# Patient Record
Sex: Female | Born: 1945 | Race: White | Hispanic: No | Marital: Married | Smoking: Never smoker
Health system: Southern US, Community
[De-identification: ages and names within clinical notes are randomized; demographics above are authoritative.]

## PROBLEM LIST (undated history)

## (undated) DIAGNOSIS — J45909 Unspecified asthma, uncomplicated: Secondary | ICD-10-CM

## (undated) DIAGNOSIS — E7800 Pure hypercholesterolemia, unspecified: Secondary | ICD-10-CM

## (undated) DIAGNOSIS — N390 Urinary tract infection, site not specified: Secondary | ICD-10-CM

## (undated) DIAGNOSIS — I10 Essential (primary) hypertension: Secondary | ICD-10-CM

## (undated) HISTORY — PX: BREAST EXCISIONAL BIOPSY: SUR124

## (undated) HISTORY — PX: BREAST LUMPECTOMY: SHX2

---

## 2011-07-24 DIAGNOSIS — A0472 Enterocolitis due to Clostridium difficile, not specified as recurrent: Secondary | ICD-10-CM | POA: Insufficient documentation

## 2011-08-30 DIAGNOSIS — M47816 Spondylosis without myelopathy or radiculopathy, lumbar region: Secondary | ICD-10-CM | POA: Insufficient documentation

## 2011-08-30 DIAGNOSIS — E559 Vitamin D deficiency, unspecified: Secondary | ICD-10-CM | POA: Insufficient documentation

## 2011-08-30 DIAGNOSIS — E7800 Pure hypercholesterolemia, unspecified: Secondary | ICD-10-CM | POA: Insufficient documentation

## 2011-08-30 DIAGNOSIS — D369 Benign neoplasm, unspecified site: Secondary | ICD-10-CM | POA: Insufficient documentation

## 2011-08-30 DIAGNOSIS — M8580 Other specified disorders of bone density and structure, unspecified site: Secondary | ICD-10-CM | POA: Insufficient documentation

## 2011-08-30 DIAGNOSIS — L719 Rosacea, unspecified: Secondary | ICD-10-CM | POA: Insufficient documentation

## 2011-08-30 DIAGNOSIS — M47812 Spondylosis without myelopathy or radiculopathy, cervical region: Secondary | ICD-10-CM | POA: Insufficient documentation

## 2014-04-24 ENCOUNTER — Emergency Department
Admission: EM | Admit: 2014-04-24 | Discharge: 2014-04-24 | Disposition: A | Discharge status: Home / Self Care | Payer: Medicare HMO | Source: Home / Self Care | Attending: Family Medicine | Admitting: Family Medicine

## 2014-04-24 ENCOUNTER — Encounter: Payer: Self-pay | Admitting: *Deleted

## 2014-04-24 DIAGNOSIS — R0789 Other chest pain: Secondary | ICD-10-CM

## 2014-04-24 MED ORDER — VALACYCLOVIR HCL 1 G PO TABS: 1000.0000 mg | ORAL_TABLET | Freq: Three times a day (TID) | ORAL | Status: DC

## 2014-04-24 MED ORDER — HYDROCODONE-ACETAMINOPHEN 5-325 MG PO TABS: ORAL_TABLET | ORAL | Status: DC

## 2014-04-24 NOTE — ED Notes (Signed)
Pt c/o right mid back pain, burning and tingling x this AM. No rash present. H/O chickenpox.

## 2014-04-24 NOTE — Discharge Instructions (Signed)
May take Ibuprofen 200mg , 4 tabs every 8 hours with food.   Chest Pain (Nonspecific) It is often hard to give a specific diagnosis for the cause of chest pain. There is always a chance that your pain could be related to something serious, such as a heart attack or a blood clot in the lungs. You need to follow up with your health care provider for further evaluation. CAUSES   Heartburn.  Pneumonia or bronchitis.  Anxiety or stress.  Inflammation around your heart (pericarditis) or lung (pleuritis or pleurisy).  A blood clot in the lung.  A collapsed lung (pneumothorax). It can develop suddenly on its own (spontaneous pneumothorax) or from trauma to the chest.  Shingles infection (herpes zoster virus). The chest wall is composed of bones, muscles, and cartilage. Any of these can be the source of the pain.  The bones can be bruised by injury.  The muscles or cartilage can be strained by coughing or overwork.  The cartilage can be affected by inflammation and become sore (costochondritis). DIAGNOSIS  Lab tests or other studies may be needed to find the cause of your pain. Your health care provider may have you take a test called an ambulatory electrocardiogram (ECG). An ECG records your heartbeat patterns over a 24-hour period. You may also have other tests, such as:  Transthoracic echocardiogram (TTE). During echocardiography, sound waves are used to evaluate how blood flows through your heart.  Transesophageal echocardiogram (TEE).  Cardiac monitoring. This allows your health care provider to monitor your heart rate and rhythm in real time.  Holter monitor. This is a portable device that records your heartbeat and can help diagnose heart arrhythmias. It allows your health care provider to track your heart activity for several days, if needed.  Stress tests by exercise or by giving medicine that makes the heart beat faster. TREATMENT   Treatment depends on what may be causing  your chest pain. Treatment may include:  Acid blockers for heartburn.  Anti-inflammatory medicine.  Pain medicine for inflammatory conditions.  Antibiotics if an infection is present.  You may be advised to change lifestyle habits. This includes stopping smoking and avoiding alcohol, caffeine, and chocolate.  You may be advised to keep your head raised (elevated) when sleeping. This reduces the chance of acid going backward from your stomach into your esophagus. Most of the time, nonspecific chest pain will improve within 2-3 days with rest and mild pain medicine.  HOME CARE INSTRUCTIONS   If antibiotics were prescribed, take them as directed. Finish them even if you start to feel better.  For the next few days, avoid physical activities that bring on chest pain. Continue physical activities as directed.  Do not use any tobacco products, including cigarettes, chewing tobacco, or electronic cigarettes.  Avoid drinking alcohol.  Only take medicine as directed by your health care provider.  Follow your health care provider's suggestions for further testing if your chest pain does not go away.  Keep any follow-up appointments you made. If you do not go to an appointment, you could develop lasting (chronic) problems with pain. If there is any problem keeping an appointment, call to reschedule. SEEK MEDICAL CARE IF:   Your chest pain does not go away, even after treatment.  You have a rash with blisters on your chest.  You have a fever. SEEK IMMEDIATE MEDICAL CARE IF:   You have increased chest pain or pain that spreads to your arm, neck, jaw, back, or abdomen.  You  have shortness of breath.  You have an increasing cough, or you cough up blood.  You have severe back or abdominal pain.  You feel nauseous or vomit.  You have severe weakness.  You faint.  You have chills. This is an emergency. Do not wait to see if the pain will go away. Get medical help at once. Call your  local emergency services (911 in [place] Do not drive yourself to the hospital. MAKE SURE YOU:   Understand these instructions.  Will watch your condition.  Will get help right away if you are not doing well or get worse. Document Released: 10/04/2004 Document Revised: 12/30/2012 Document Reviewed: 07/31/2007 [facility] Patient Information 2015 [place], [place]. This information is not intended to replace advice given to you by your health care provider. Make sure you discuss any questions you have with your health care provider.

## 2014-04-24 NOTE — ED Provider Notes (Signed)
CSN: [id number]     Arrival date & time 04/24/14  1221 History   First MD Initiated Contact with Patient 04/24/14 1239     Chief Complaint  Patient presents with  . Back Pain      HPI Comments: Patient complains of developing unexplained fatigue about three days ago, and chills during the past two days.  This morning she awoke with a painful burning sensation over her right upper back.  No rash.  No cough.  Her pain is not pleuritic. She notes that she had the Zostavax immunization seven years ago.  The history is provided by the patient and the spouse.    History reviewed. No pertinent past medical history. Past Surgical History  Procedure Laterality Date  . Breast lumpectomy Right    Family History  Problem Relation Age of Onset  . Diabetes Father    History  Substance Use Topics  . Smoking status: Never Smoker   . Smokeless tobacco: Never Used  . Alcohol Use: No   OB History    No data available     Review of Systems  Constitutional: Positive for chills and fatigue. Negative for fever, diaphoresis, appetite change and unexpected weight change.  HENT: Negative.   Eyes: Negative.   Respiratory: Negative.   Cardiovascular: Negative.   Gastrointestinal: Negative.   Genitourinary: Negative.   Musculoskeletal: Positive for back pain. Negative for myalgias, joint swelling, arthralgias, neck pain and neck stiffness.  Skin: Negative for rash.  Neurological: Negative for headaches.    Allergies  Codeine  Home Medications   Prior to Admission medications   Medication Sig Start Date End Date Taking? Authorizing Provider  Celecoxib (CELEBREX PO) Take by mouth.   Yes Historical Provider, MD  Red Yeast Rice Extract (RED YEAST RICE PO) Take by mouth.   Yes Historical Provider, MD  HYDROcodone-acetaminophen (NORCO/VICODIN) 5-325 MG per tablet Take one by mouth at bedtime as needed for pain 04/24/14   Kandra Nicolas, MD  valACYclovir (VALTREX) 1000 MG tablet Take 1 tablet  (1,000 mg total) by mouth 3 (three) times daily. 04/24/14   Kandra Nicolas, MD   BP 171/75 mmHg  Pulse 63  Resp 16  Ht 5\' 5"  (1.651 m)  Wt 145 lb (65.772 kg)  BMI 24.13 kg/m2  SpO2 99% Physical Exam  Constitutional: She is oriented to person, place, and time. She appears well-developed and well-nourished. No distress.  HENT:  Head: Normocephalic.  Nose: Nose normal.  Mouth/Throat: Oropharynx is clear and moist.  Eyes: Conjunctivae are normal. Pupils are equal, round, and reactive to light.  Neck: Neck supple.  Cardiovascular: Normal heart sounds.   Pulmonary/Chest: Breath sounds normal.    Area of patient's back pain is noted in blue on diagram.  However, there is no tenderness to palpation there, and no erythema or warmth.  No rash present.  Abdominal: Bowel sounds are normal. There is no tenderness.  Musculoskeletal: She exhibits no edema or tenderness.  Lymphadenopathy:    She has no cervical adenopathy.  Neurological: She is alert and oriented to person, place, and time.  Skin: Skin is warm and dry. No rash noted.  Nursing note and vitals reviewed.   ED Course  Procedures  none   MDM   1. Posterior chest pain; presumptive early herpes zoster    Will empirically begin Valtrex.  Lortab for pain at bedtime (has take hydrocodone without adverse effect) May take Ibuprofen 200mg , 4 tabs every 8 hours with food.  Followup  with Family Doctor if not improved in one week.     Kandra Nicolas, MD 04/24/14 1300

## 2014-08-04 ENCOUNTER — Encounter: Payer: Self-pay | Admitting: *Deleted

## 2014-08-04 ENCOUNTER — Emergency Department
Admission: EM | Admit: 2014-08-04 | Discharge: 2014-08-04 | Disposition: A | Discharge status: Home / Self Care | Payer: Medicare HMO | Source: Home / Self Care | Attending: Family Medicine | Admitting: Family Medicine

## 2014-08-04 DIAGNOSIS — R300 Dysuria: Secondary | ICD-10-CM | POA: Diagnosis not present

## 2014-08-04 HISTORY — DX: Pure hypercholesterolemia, unspecified: E78.00

## 2014-08-04 LAB — POCT CBC W AUTO DIFF (K'VILLE URGENT CARE)

## 2014-08-04 LAB — POCT URINALYSIS DIP (MANUAL ENTRY)
Bilirubin, UA: NEGATIVE
Glucose, UA: NEGATIVE
Ketones, POC UA: NEGATIVE
Leukocytes, UA: NEGATIVE
Nitrite, UA: NEGATIVE
PROTEIN UA: NEGATIVE
RBC UA: NEGATIVE
Spec Grav, UA: 1.02 (ref 1.005–1.03)
Urobilinogen, UA: 0.2 (ref 0–1)
pH, UA: 5 (ref 5–8)

## 2014-08-04 MED ORDER — SULFAMETHOXAZOLE-TRIMETHOPRIM 800-160 MG PO TABS: 1.0000 | ORAL_TABLET | Freq: Two times a day (BID) | ORAL | Status: DC

## 2014-08-04 NOTE — ED Provider Notes (Signed)
CSN: [id number]     Arrival date & time 08/04/14  1047 History   First MD Initiated Contact with Patient 08/04/14 1151     Chief Complaint  Patient presents with  . Urinary Tract Infection      HPI Comments: Last night patient developed frequency and nocturia, and today mild dysuria.  She complains of lower abdominal bloating and discomfort, and increased lower back discomfort.  No fevers, chills, and sweats.  She states that she had UTI three weeks ago treated with Cipro initially, and then switched to Septra.  She has been taking cranberry pills.  She took a left-over dose of Cipro last night.  Patient is a 69 y.o. female presenting with dysuria. The history is provided by the patient.  Dysuria Pain quality:  Burning Pain severity:  Mild Onset quality:  Sudden Duration:  1 day Timing:  Constant Progression:  Worsening Chronicity:  Recurrent Recent urinary tract infections: yes   Relieved by:  Nothing Worsened by:  Nothing tried Ineffective treatments: cranberry pills. Urinary symptoms: frequent urination and hesitancy   Urinary symptoms: no discolored urine, no foul-smelling urine, no hematuria and no bladder incontinence   Associated symptoms: abdominal pain   Associated symptoms: no fever, no flank pain, no genital lesions, no nausea, no vaginal discharge and no vomiting   Risk factors: recurrent urinary tract infections     Past Medical History  Diagnosis Date  . High cholesterol    Past Surgical History  Procedure Laterality Date  . Breast lumpectomy Right    Family History  Problem Relation Age of Onset  . Diabetes Father   . Liver disease Father   . Alzheimer's disease Mother    History  Substance Use Topics  . Smoking status: Never Smoker   . Smokeless tobacco: Never Used  . Alcohol Use: No   OB History    No data available     Review of Systems  Constitutional: Negative for fever.  Gastrointestinal: Positive for abdominal pain. Negative for nausea and  vomiting.  Genitourinary: Positive for dysuria. Negative for flank pain and vaginal discharge.  All other systems reviewed and are negative.   Allergies  Codeine  Home Medications   Prior to Admission medications   Medication Sig Start Date End Date Taking? Authorizing Provider  Celecoxib (CELEBREX PO) Take by mouth.    Historical Provider, MD  Red Yeast Rice Extract (RED YEAST RICE PO) Take by mouth.    Historical Provider, MD  sulfamethoxazole-trimethoprim (BACTRIM DS,SEPTRA DS) 800-160 MG per tablet Take 1 tablet by mouth 2 (two) times daily. 08/04/14   Kandra Nicolas, MD  valACYclovir (VALTREX) 1000 MG tablet Take 1 tablet (1,000 mg total) by mouth 3 (three) times daily. 04/24/14   Kandra Nicolas, MD   BP 135/80 mmHg  Pulse 63  Temp(Src) 98.4 F (36.9 C) (Oral)  Resp 14  Wt 144 lb (65.318 kg)  SpO2 97% Physical Exam  Constitutional: She is oriented to person, place, and time. She appears well-developed and well-nourished. No distress.  HENT:  Head: Normocephalic.  Mouth/Throat: Oropharynx is clear and moist.  Eyes: Conjunctivae are normal. Pupils are equal, round, and reactive to light.  Neck: Neck supple.  Cardiovascular: Normal heart sounds.   Pulmonary/Chest: Breath sounds normal.  Abdominal: Bowel sounds are normal. She exhibits no distension and no mass. There is no hepatosplenomegaly. There is tenderness. There is no rigidity, no rebound, no guarding, no CVA tenderness, no tenderness at McBurney's point and negative Murphy's  sign.    Patient has vague abdominal tenderness as noted on diagram.  Negative iliopsoas and obdurator tests.    Musculoskeletal: She exhibits no edema.  Lymphadenopathy:    She has no cervical adenopathy.  Neurological: She is alert and oriented to person, place, and time.  Skin: Skin is warm and dry. No rash noted.  Nursing note and vitals reviewed.   ED Course  Procedures  None    Labs Reviewed  URINE CULTURE  POCT URINALYSIS DIP  (MANUAL ENTRY) negative  POCT CBC W AUTO DIFF ([facility]):  WBC 5.7; LY 32.4; MO 4.5; GR 63.1; Hgb 15.3; Platelets 214       MDM   1. Dysuria    Urine culture pending. Begin Septra DS BID for 10 days. Continue increased fluid intake.  May continue taking cranberry pills. If symptoms become significantly worse during the night or over the weekend, proceed to the local emergency room.  Followup with Family Doctor if not improved in two weeks.    Kandra Nicolas, MD 08/08/14 [phone]

## 2014-08-04 NOTE — ED Notes (Signed)
Dysuria.  Started last night around 11.  No Blood in urine, fever, or chilling.  1 dose of Cipro last night at 11.

## 2014-08-04 NOTE — Discharge Instructions (Signed)
Continue increased fluid intake.  May continue taking cranberry pills. If symptoms become significantly worse during the night or over the weekend, proceed to the local emergency room.    Dysuria Dysuria is the medical term for pain with urination. There are many causes for dysuria, but urinary tract infection is the most common. If a urinalysis was performed it can show that there is a urinary tract infection. A urine culture confirms that you or your child is sick. You will need to follow up with a healthcare provider because:  If a urine culture was done you will need to know the culture results and treatment recommendations.  If the urine culture was positive, you or your child will need to be put on antibiotics or know if the antibiotics prescribed are the right antibiotics for your urinary tract infection.  If the urine culture is negative (no urinary tract infection), then other causes may need to be explored or antibiotics need to be stopped. Today laboratory work may have been done and there does not seem to be an infection. If cultures were done they will take at least 24 to 48 hours to be completed. Today x-rays may have been taken and they read as normal. No cause can be found for the problems. The x-rays may be re-read by a radiologist and you will be contacted if additional findings are made. You or your child may have been put on medications to help with this problem until you can see your primary caregiver. If the problems get better, see your primary caregiver if the problems return. If you were given antibiotics (medications which kill germs), take all of the mediations as directed for the full course of treatment.  If laboratory work was done, you need to find the results. Leave a telephone number where you can be reached. If this is not possible, make sure you find out how you are to get test results. HOME CARE INSTRUCTIONS   Drink lots of fluids. For adults, drink eight, 8  ounce glasses of clear juice or water a day. For children, replace fluids as suggested by your caregiver.  Empty the bladder often. Avoid holding urine for long periods of time.  After a bowel movement, women should cleanse front to back, using each tissue only once.  Empty your bladder before and after sexual intercourse.  Take all the medicine given to you until it is gone. You may feel better in a few days, but TAKE ALL MEDICINE.  Avoid caffeine, tea, alcohol and carbonated beverages, because they tend to irritate the bladder.  In men, alcohol may irritate the prostate.  Only take over-the-counter or prescription medicines for pain, discomfort, or fever as directed by your caregiver.  If your caregiver has given you a follow-up appointment, it is very important to keep that appointment. Not keeping the appointment could result in a chronic or permanent injury, pain, and disability. If there is any problem keeping the appointment, you must call back to this facility for assistance. SEEK IMMEDIATE MEDICAL CARE IF:   Back pain develops.  A fever develops.  There is nausea (feeling sick to your stomach) or vomiting (throwing up).  Problems are no better with medications or are getting worse. MAKE SURE YOU:   Understand these instructions.  Will watch your condition.  Will get help right away if you are not doing well or get worse. Document Released: 09/23/2003 Document Revised: 03/19/2011 Document Reviewed: 07/31/2007 [facility] Patient Information 2015 [place], [place]. This information  is not intended to replace advice given to you by your health care provider. Make sure you discuss any questions you have with your health care provider.

## 2014-08-05 LAB — URINE CULTURE
Colony Count: NO GROWTH
ORGANISM ID, BACTERIA: NO GROWTH

## 2014-08-10 ENCOUNTER — Telehealth: Payer: Self-pay | Admitting: *Deleted

## 2014-08-11 ENCOUNTER — Emergency Department
Admission: EM | Admit: 2014-08-11 | Discharge: 2014-08-11 | Disposition: A | Discharge status: Home / Self Care | Payer: Medicare HMO | Source: Home / Self Care

## 2014-08-11 ENCOUNTER — Encounter: Payer: Self-pay | Admitting: *Deleted

## 2014-08-11 DIAGNOSIS — M549 Dorsalgia, unspecified: Secondary | ICD-10-CM

## 2014-08-11 DIAGNOSIS — Z8619 Personal history of other infectious and parasitic diseases: Secondary | ICD-10-CM | POA: Diagnosis not present

## 2014-08-11 MED ORDER — HYDROCODONE-ACETAMINOPHEN 5-325 MG PO TABS: 1.0000 | ORAL_TABLET | Freq: Four times a day (QID) | ORAL | Status: DC | PRN

## 2014-08-11 MED ORDER — IBUPROFEN 600 MG PO TABS: 600.0000 mg | ORAL_TABLET | Freq: Four times a day (QID) | ORAL | Status: DC | PRN

## 2014-08-11 MED ORDER — VALACYCLOVIR HCL 1 G PO TABS: 1000.0000 mg | ORAL_TABLET | Freq: Three times a day (TID) | ORAL | Status: DC

## 2014-08-11 NOTE — ED Provider Notes (Signed)
CSN: [id number]     Arrival date & time 08/11/14  1659 History   None    Chief Complaint  Patient presents with  . Back Pain   (Consider location/radiation/quality/duration/timing/severity/associated sxs/prior Treatment) HPI The patient is a 69 year old female presenting to urgent care with sudden onset right mid to lower back pain that started this morning.  Patient states pain is moderate in severity, aching and throbbing feels similar to when she was treated for shingles 4 months ago in April of this year.  Patient states she did get the shingles vaccine.  However, in April when she had similar pain.  She did break out into a small red vesicular rash a few days after starting Valtrex.  Patient states symptoms at that time lasted about 6 weeks but completely resolved.  Patient states pain today is in the exact same location as it was 4 months ago.  She has not noticed any rash as of yet. .  Denies fevers, chills, nausea, vomiting or diarrhea.  Denies recent heavy lifting or falls.  Denies known injuries.  Denies any other symptoms.  Past Medical History  Diagnosis Date  . High cholesterol    Past Surgical History  Procedure Laterality Date  . Breast lumpectomy Right    Family History  Problem Relation Age of Onset  . Diabetes Father   . Liver disease Father   . Alzheimer's disease Mother    History  Substance Use Topics  . Smoking status: Never Smoker   . Smokeless tobacco: Never Used  . Alcohol Use: No   OB History    No data available     Review of Systems  Constitutional: Negative for fever and chills.  Gastrointestinal: Negative for nausea and vomiting.  Genitourinary: Negative for dysuria, hematuria and flank pain.  Musculoskeletal: Positive for myalgias and back pain. Negative for joint swelling, arthralgias, gait problem, neck pain and neck stiffness.  Skin: Negative for color change, rash and wound.  Neurological: Negative for weakness and numbness.    Allergies    Codeine  Home Medications   Prior to Admission medications   Medication Sig Start Date End Date Taking? Authorizing Provider  Celecoxib (CELEBREX PO) Take by mouth.    Historical Provider, MD  HYDROcodone-acetaminophen (NORCO/VICODIN) 5-325 MG per tablet Take 1 tablet by mouth every 6 (six) hours as needed for moderate pain or severe pain. 08/11/14   Noland Fordyce, PA-C  ibuprofen (ADVIL,MOTRIN) 600 MG tablet Take 1 tablet (600 mg total) by mouth every 6 (six) hours as needed. 08/11/14   Noland Fordyce, PA-C  Red Yeast Rice Extract (RED YEAST RICE PO) Take by mouth.    Historical Provider, MD  valACYclovir (VALTREX) 1000 MG tablet Take 1 tablet (1,000 mg total) by mouth 3 (three) times daily. For 7 days 08/11/14   Noland Fordyce, PA-C   BP 119/71 mmHg  Pulse 82  Temp(Src) 98.5 F (36.9 C) (Oral)  Resp 16  SpO2 97% Physical Exam  Constitutional: She is oriented to person, place, and time. She appears well-developed and well-nourished.  HENT:  Head: Normocephalic and atraumatic.  Eyes: EOM are normal.  Neck: Normal range of motion. Neck supple.  Cardiovascular: Normal rate.   Pulmonary/Chest: Effort normal.  Musculoskeletal: Normal range of motion. She exhibits tenderness. She exhibits no edema.       Back:  Tenderness to Right mid to lower back even with light touch. No bony tenderness. FROM upper and lower extremities with 5/5 strength bilaterally.  Neurological: She  is alert and oriented to person, place, and time.  Normal sensation in upper and lower extremities bilaterally, normal gait.  Skin: Skin is warm and dry. No rash noted. No erythema.  Psychiatric: She has a normal mood and affect. Her behavior is normal.  Nursing note and vitals reviewed.   ED Course  Procedures (including critical care time) Labs Review Labs Reviewed - No data to display  Imaging Review No results found.   MDM   1. Mid back pain on right side   2. History of shingles     Patient is a  69 year old female with history of shingles rash 4 months ago presenting to urgent care with back pain similar to last time she was treated for shingles. Patient is tender with light touch to right mid to lower back.  No bony tenderness.  No rash at this time.  Due to patient's history will treat as a mother shingles outbreak starting patient on Valtrex.  Also prescribed Vicodin and ibuprofen as needed for pain.  Patient counseled on use of narcotic pain medications. Counseled not to combine these medications with others containing tylenol. Urged not to drink alcohol, drive, or perform any other activities that requires focus while taking these medications.   Advise patient to follow-up with PCP within one week if not improving, sooner if worsening. Pt verbalized understanding and agreement with tx plan.      Noland Fordyce, PA-C 08/11/14 [phone]

## 2014-08-11 NOTE — ED Notes (Signed)
Pt c/o right sided back pain without injury x this AM. Pain is throbbing. She was treated for shingles in April, reports same sensation on right side of back with shingles.

## 2014-08-11 NOTE — Discharge Instructions (Signed)
Lortab (hydrocodone-acetaminophen) is a narcotic pain medication, do not combine these medications with others containing tylenol. While taking, do not drink alcohol, drive, or perform any other activities that requires focus while taking these medications.   If you take the prescribed ibuprofen, do not also take your celebrex as both are considered NSAIDs, which help pain and inflammation.

## 2014-10-01 LAB — HM COLONOSCOPY

## 2015-01-19 LAB — HM DEXA SCAN: HM Dexa Scan: NEGATIVE

## 2015-04-05 ENCOUNTER — Emergency Department
Admission: EM | Admit: 2015-04-05 | Discharge: 2015-04-05 | Disposition: A | Discharge status: Home / Self Care | Payer: Medicare HMO | Source: Home / Self Care | Attending: Emergency Medicine | Admitting: Emergency Medicine

## 2015-04-05 ENCOUNTER — Encounter: Payer: Self-pay | Admitting: *Deleted

## 2015-04-05 DIAGNOSIS — R091 Pleurisy: Secondary | ICD-10-CM

## 2015-04-05 DIAGNOSIS — J069 Acute upper respiratory infection, unspecified: Secondary | ICD-10-CM | POA: Diagnosis not present

## 2015-04-05 DIAGNOSIS — R071 Chest pain on breathing: Secondary | ICD-10-CM

## 2015-04-05 MED ORDER — PREDNISONE 10 MG (21) PO TBPK: ORAL_TABLET | ORAL | Status: DC

## 2015-04-05 MED ORDER — AZITHROMYCIN 250 MG PO TABS: ORAL_TABLET | ORAL | Status: DC

## 2015-04-05 NOTE — ED Provider Notes (Signed)
CSN: [id number]     Arrival date & time 04/05/15  1455 History   First MD Initiated Contact with Patient 04/05/15 1507     Chief Complaint  Patient presents with  . Chest Pain  . Sinus Problem    HPI  Sheryl Freeman is a 70 y.o. female who complains of onset of URI symptoms 8 days ago with sinus congestion and discolored rhinorrhea associated with low-grade fever. It seemed to improve somewhat 4 days ago, but now worsening discolored rhinorrhea, low-grade fever, mild facial pain, occasional nonproductive cough. Then, developed mild pleuritic sharp and pressure chest pain. Not exertional cp. Feels mild anterior wheezing at times with mild dyspnea with the wheezing with exertion. Denies radiating pain, numbness, nausea, diaphoresis.     Has been using over-the-counter treatment for sinus sxs, eg mucinex, which helps a little. Denies a current diagnosis of asthma, but she states she might of had asthma as a child which she outgrew.  She states she is going out of town in a couple days and she requests aggressive treatment with antibiotic and oral prednisone, as this has helped similar symptoms a few years ago.  No chills/sweats +  Fever  +  Nasal congestion +  Discolored Post-nasal drainage Positive sinus pain/pressure No sore throat  + Minimal cough Positive wheezing Mild chest congestion No hemoptysis No pleuritic pain  No itchy/red eyes No earache  No nausea No vomiting No abdominal pain No diarrhea  No skin rashes + Mild Fatigue. No syncope or focal neurologic symptoms No myalgias No headache  Past Medical History  Diagnosis Date  . High cholesterol    Past Surgical History  Procedure Laterality Date  . Breast lumpectomy Right    Family History  Problem Relation Age of Onset  . Diabetes Father   . Liver disease Father   . Alzheimer's disease Mother    Social History  Substance Use Topics  . Smoking status: Never Smoker   . Smokeless tobacco: Never Used  .  Alcohol Use: No   OB History    No data available     Review of Systems  All other systems reviewed and are negative.   Allergies  Codeine  Home Medications   Prior to Admission medications   Medication Sig Start Date End Date Taking? Authorizing Provider  azithromycin (ZITHROMAX Z-PAK) 250 MG tablet Take 2 tablets on day one, then 1 tablet daily on days 2 through 5 04/05/15   Jacqulyn Cane, MD  Celecoxib (CELEBREX PO) Take by mouth.    Historical Provider, MD  ibuprofen (ADVIL,MOTRIN) 600 MG tablet Take 1 tablet (600 mg total) by mouth every 6 (six) hours as needed. 08/11/14   Noland Fordyce, PA-C  predniSONE (STERAPRED UNI-PAK 21 TAB) 10 MG (21) TBPK tablet Take as directed for 6 days.--Take 6 on day 1, 5 on day 2, 4 on day 3, then 3 on day 4, then 2  on day 5, then 1 on day 6. 04/05/15   Jacqulyn Cane, MD  Red Yeast Rice Extract (RED YEAST RICE PO) Take by mouth.    Historical Provider, MD  valACYclovir (VALTREX) 1000 MG tablet Take 1 tablet (1,000 mg total) by mouth 3 (three) times daily. For 7 days 08/11/14   Noland Fordyce, PA-C   Meds Ordered and Administered this Visit  Medications - No data to display  BP 155/77 mmHg  Pulse 73  Temp(Src) 98.1 F (36.7 C) (Oral)  Resp 18  SpO2 98% No data found.  Physical Exam  Constitutional: She is oriented to person, place, and time. She appears well-developed and well-nourished. No distress.  HENT:  Head: Normocephalic and atraumatic.  Right Ear: Tympanic membrane, external ear and ear canal normal.  Left Ear: Tympanic membrane, external ear and ear canal normal.  Nose: Mucosal edema and rhinorrhea present. Right sinus exhibits maxillary sinus tenderness. Left sinus exhibits maxillary sinus tenderness.  Mouth/Throat: Oropharynx is clear and moist. No oral lesions. No oropharyngeal exudate.  Eyes: Right eye exhibits no discharge. Left eye exhibits no discharge. No scleral icterus.  Neck: Neck supple. No JVD present. No tracheal  deviation present.  Cardiovascular: Normal rate, regular rhythm and normal heart sounds.  Exam reveals no gallop and no friction rub.   No murmur heard. Pulmonary/Chest: Effort normal. She has wheezes (Very mild late expiratory wheezes anteriorly, especially on forced expiration. Good and equal air movement bilaterally). She has rhonchi. She has no rales.  Abdominal: She exhibits no distension.  Musculoskeletal: She exhibits no edema.  Lymphadenopathy:    She has no cervical adenopathy.  Neurological: She is alert and oriented to person, place, and time. No cranial nerve deficit.  Skin: Skin is warm and dry. No rash noted.  Psychiatric: She has a normal mood and affect.  Nursing note and vitals reviewed.   ED Course  ED EKG  Date/Time: 04/05/2015 2:59 PM Performed by: Burnett Harry, DAVID Authorized by: Theone Murdoch A Rhythm: sinus rhythm Ectopy comments: None Rate: normal QRS axis: normal Conduction: conduction normal ST Segments: ST segments normal T Waves: T waves normal Other: no other findings Clinical impression: normal ECG    Labs Review Labs Reviewed - No data to display  Imaging Review No results found.    MDM   1. Acute upper respiratory infection   2. Pleurisy   3. Chest pain on breathing   Likely has acute maxillary sinusitis, mild acute bronchitis with pleurisy and mild wheezing. O2 saturation normal 98%. Good air movement bilaterally on auscultation. No respiratory distress. Chest discomfort is only pleuritic. EKG normal. No evidence of acute cardiorespiratory event. She declined chest x-ray. She declined DuoNeb or albuterol inhaler.  Treatment options discussed, as well as risks, benefits, alternatives. Patient voiced understanding and agreement with the following plans: Discharge Medication List as of 04/05/2015  3:33 PM    START taking these medications   Details  azithromycin (ZITHROMAX Z-PAK) 250 MG tablet Take 2 tablets on day one, then 1 tablet  daily on days 2 through 5, Normal    predniSONE (STERAPRED UNI-PAK 21 TAB) 10 MG (21) TBPK tablet Take as directed for 6 days.--Take 6 on day 1, 5 on day 2, 4 on day 3, then 3 on day 4, then 2  on day 5, then 1 on day 6., Normal       She declined steroid nasal spray. Other symptomatic care discussed. May use Tylenol or ibuprofen when necessary discomfort. Follow-up with your primary care doctor in 5-7 days if not improving, or sooner if symptoms become worse. Precautions discussed. Red flags discussed. Questions invited and answered. Patient voiced understanding and agreement.     Jacqulyn Cane, MD 04/05/15 1540

## 2015-04-05 NOTE — ED Notes (Signed)
Pt c/o 1 week of sinus fullness, and discharge, little cough. 2 days ago developed central CP, described at "someone sitting on my chest" and SOB with exertion. Denies radiating pain, numbness, nausea, diaphoresis.

## 2015-06-02 ENCOUNTER — Emergency Department
Admission: EM | Admit: 2015-06-02 | Discharge: 2015-06-02 | Disposition: A | Discharge status: Home / Self Care | Payer: Medicare HMO | Source: Home / Self Care | Attending: Family Medicine | Admitting: Family Medicine

## 2015-06-02 ENCOUNTER — Encounter: Payer: Self-pay | Admitting: *Deleted

## 2015-06-02 DIAGNOSIS — R300 Dysuria: Secondary | ICD-10-CM | POA: Diagnosis not present

## 2015-06-02 DIAGNOSIS — N390 Urinary tract infection, site not specified: Secondary | ICD-10-CM

## 2015-06-02 LAB — POCT URINALYSIS DIP (MANUAL ENTRY)
Bilirubin, UA: NEGATIVE
Glucose, UA: NEGATIVE
Nitrite, UA: POSITIVE — AB
Protein Ur, POC: NEGATIVE
Spec Grav, UA: >=1.03 (ref 1.005–1.03)
Urobilinogen, UA: 0.2 (ref 0–1)
pH, UA: 5 (ref 5–8)

## 2015-06-02 MED ORDER — SULFAMETHOXAZOLE-TRIMETHOPRIM 800-160 MG PO TABS: 1.0000 | ORAL_TABLET | Freq: Two times a day (BID) | ORAL | Status: AC

## 2015-06-02 NOTE — ED Notes (Signed)
Pt c/o dysuria and bloating since this AM. Denies flank pain, hematuria or fever.

## 2015-06-02 NOTE — ED Provider Notes (Signed)
CSN: [id number]     Arrival date & time 06/02/15  1339 History   First MD Initiated Contact with Patient 06/02/15 1400     Chief Complaint  Patient presents with  . Dysuria   (Consider location/radiation/quality/duration/timing/severity/associated sxs/prior Treatment) HPI The pt is a 70yo female presenting to [facility] with c/o sudden onset, gradually worsening dysuria and bloating with lower abdominal discomfort that started this morning.  She does recall "not feeling well" last night then urinary symptoms this morning.  Symptoms feel similar to prior UTIs. She has not tried anything for symptoms yet as she knows Azo can alter test results. She is leaving to go to the beach for 1 week this weekend and wants to make sure she is okay to go. Denies fever, chills, n/v/d. Denies flank pain or hematuria.   Past Medical History  Diagnosis Date  . High cholesterol    Past Surgical History  Procedure Laterality Date  . Breast lumpectomy Right    Family History  Problem Relation Age of Onset  . Diabetes Father   . Liver disease Father   . Alzheimer's disease Mother    Social History  Substance Use Topics  . Smoking status: Never Smoker   . Smokeless tobacco: Never Used  . Alcohol Use: No   OB History    No data available     Review of Systems  Constitutional: Negative for fever and chills.  HENT: Negative for congestion, ear pain, sore throat, trouble swallowing and voice change.   Respiratory: Negative for cough and shortness of breath.   Cardiovascular: Negative for chest pain and palpitations.  Gastrointestinal: Positive for abdominal pain ( lower abdominal discomfort). Negative for nausea, vomiting and diarrhea.  Genitourinary: Positive for dysuria, urgency and frequency. Negative for hematuria, flank pain and decreased urine volume.  Musculoskeletal: Negative for myalgias, back pain and arthralgias.  Skin: Negative for rash.    Allergies  Codeine  Home Medications   Prior to  Admission medications   Medication Sig Start Date End Date Taking? Authorizing Provider  Celecoxib (CELEBREX PO) Take by mouth.    Historical Provider, MD  Red Yeast Rice Extract (RED YEAST RICE PO) Take by mouth.    Historical Provider, MD  sulfamethoxazole-trimethoprim (BACTRIM DS,SEPTRA DS) 800-160 MG tablet Take 1 tablet by mouth 2 (two) times daily. For 10 days 06/02/15 06/09/15  Noland Fordyce, PA-C   Meds Ordered and Administered this Visit  Medications - No data to display  BP 162/88 mmHg  Pulse 91  Temp(Src) 98 F (36.7 C) (Oral)  Wt 150 lb (68.04 kg)  SpO2 95% No data found.   Physical Exam  Constitutional: She is oriented to person, place, and time. She appears well-developed and well-nourished.  HENT:  Head: Normocephalic and atraumatic.  Mouth/Throat: Oropharynx is clear and moist.  Eyes: EOM are normal.  Neck: Normal range of motion.  Cardiovascular: Normal rate, regular rhythm and normal heart sounds.   Pulmonary/Chest: Effort normal and breath sounds normal. No respiratory distress. She has no wheezes. She has no rales.  Abdominal: Soft. She exhibits no distension and no mass. There is tenderness. There is no rebound, no guarding and no CVA tenderness.  Soft, non-distended. Lower abdominal tenderness w/o rebound, guarding or masses.  Musculoskeletal: Normal range of motion.  Neurological: She is alert and oriented to person, place, and time.  Skin: Skin is warm and dry.  Psychiatric: She has a normal mood and affect. Her behavior is normal.  Nursing note and vitals  reviewed.   ED Course  Procedures (including critical care time)  Labs Review Labs Reviewed  URINE CULTURE    Imaging Review No results found.   MDM   1. Dysuria   2. UTI (lower urinary tract infection)    Pt c/o sudden onset UTI symptoms that started this morning.  Pt is afebrile, well hydrated. NAD.  UA: c/w UTI, nitrite positive, will send culture.  Per medical records pt has been on  bactrim for 10 days in the past for UTIs. She notes Cipro does not work for her. Denies side effects with bactrim.  Rx: Bactrim BID for 10 days Encouraged to stay well hydrated. F/u with PCP or go to urgent care out of town if not improving in 4-5 days. Patient verbalized understanding and agreement with treatment plan.    Noland Fordyce, PA-C 06/02/15 1414

## 2015-06-02 NOTE — Discharge Instructions (Signed)
Please take antibiotics as prescribed and be sure to complete entire course even if you start to feel better to ensure infection does not come back.  You may try over the counter Azo to help with urinary discomfort. Please be sure to stay well hydrated and to follow up with a primary care provider or stop by an urgent care if still out of town and having symptoms not improving in 4-5 days.

## 2015-06-05 ENCOUNTER — Telehealth: Payer: Self-pay | Admitting: Emergency Medicine

## 2015-06-05 LAB — URINE CULTURE: Colony Count: >=100000

## 2015-06-07 NOTE — ED Notes (Addendum)
Per Dr. Assunta Found, Augmentin 875mg  BID #14 called in to [facility] [phone]. Left on provider VM. Eulis Foster, had spoken with patient regarding need to change Rx due to [facility]. Pt is out of town and wants Rx called to this pharmacy.

## 2015-07-15 LAB — HM MAMMOGRAPHY

## 2015-08-22 LAB — HM PAP SMEAR: HM Pap smear: NORMAL

## 2015-09-05 DIAGNOSIS — M545 Low back pain, unspecified: Secondary | ICD-10-CM | POA: Insufficient documentation

## 2015-09-05 DIAGNOSIS — G8929 Other chronic pain: Secondary | ICD-10-CM | POA: Insufficient documentation

## 2015-11-18 ENCOUNTER — Emergency Department
Admission: EM | Admit: 2015-11-18 | Discharge: 2015-11-18 | Disposition: A | Discharge status: Home / Self Care | Payer: Medicare HMO | Source: Home / Self Care | Attending: Family Medicine | Admitting: Family Medicine

## 2015-11-18 ENCOUNTER — Encounter: Payer: Self-pay | Admitting: Emergency Medicine

## 2015-11-18 DIAGNOSIS — R05 Cough: Secondary | ICD-10-CM

## 2015-11-18 DIAGNOSIS — R058 Other specified cough: Secondary | ICD-10-CM

## 2015-11-18 MED ORDER — HYDROCOD POLST-CPM POLST ER 10-8 MG/5ML PO SUER: ORAL | 0 refills | Status: DC

## 2015-11-18 MED ORDER — AZITHROMYCIN 250 MG PO TABS: ORAL_TABLET | ORAL | 0 refills | Status: DC

## 2015-11-18 NOTE — ED Triage Notes (Signed)
Productive Cough with yellow mucus, started 5 weeks ago, Has taken 2 rounds of ABTs, Steroids, had x-ray, did not have pneumonia. Has never completely gotten well, cough started back yesterday. Denies fever

## 2015-11-18 NOTE — Discharge Instructions (Signed)
Take plain guaifenesin (1200mg  extended release tabs such as Mucinex) twice daily, with plenty of water, for cough and congestion.  Get adequate rest.   Also recommend using saline nasal spray several times daily and saline nasal irrigation (AYR is a common brand).   Try warm salt water gargles for sore throat.  Stop all antihistamines for now, and other non-prescription cough/cold preparations. May take Ibuprofen 200mg , 4 tabs every 8 hours with food for chest/sternum discomfort.

## 2015-11-18 NOTE — ED Provider Notes (Signed)
[facility]    CSN: [id number] Arrival date & time: 11/18/15  1131     History   Chief Complaint Chief Complaint  Patient presents with  . Cough    HPI Sheryl Freeman is a 70 y.o. female.   Patient reports that about 5 weeks ago she developed a respiratory infection, treated initially with Augmentin.  She did not improve.  A chest X-ray done on follow-up visit was negative, and she was then started on Levaquin and prednisone. She states that she finally improved after about two weeks, although a mild cough persisted. She now complains of increasing cough and sinus congestion during the past two days with increased fatigue but no fever.  She has been taking guaifenesin.    The history is provided by the patient and the spouse.    Past Medical History:  Diagnosis Date  . High cholesterol     There are no active problems to display for this patient.   Past Surgical History:  Procedure Laterality Date  . BREAST LUMPECTOMY Right     OB History    No data available       Home Medications    Prior to Admission medications   Medication Sig Start Date End Date Taking? Authorizing Provider  azithromycin (ZITHROMAX Z-PAK) 250 MG tablet Take 2 tabs today; then begin one tab once daily for 4 more days. 11/18/15   Kandra Nicolas, MD  Celecoxib (CELEBREX PO) Take by mouth.    Historical Provider, MD  chlorpheniramine-HYDROcodone [facility]) 10-8 MG/5ML SUER Take 50mL by mouth HS prn cough 11/18/15   Kandra Nicolas, MD  Red Yeast Rice Extract (RED YEAST RICE PO) Take by mouth.    Historical Provider, MD    Family History Family History  Problem Relation Age of Onset  . Diabetes Father   . Liver disease Father   . Alzheimer's disease Mother     Social History Social History  Substance Use Topics  . Smoking status: Never Smoker  . Smokeless tobacco: Never Used  . Alcohol use No     Allergies   Codeine   Review of Systems Review  of Systems  ? sore throat + cough No pleuritic pain No wheezing + nasal congestion + post-nasal drainage No sinus pain/pressure No itchy/red eyes No earache No hemoptysis No SOB No fever/chills No nausea No vomiting No abdominal pain No diarrhea No urinary symptoms No skin rash + fatigue No myalgias No headache Used OTC meds without relief    Physical Exam Triage Vital Signs ED Triage Vitals  Enc Vitals Group     BP 11/18/15 1147 145/81     Pulse Rate 11/18/15 1147 81     Resp --      Temp 11/18/15 1147 97.9 F (36.6 C)     Temp Source 11/18/15 1147 Oral     SpO2 11/18/15 1147 98 %     Weight 11/18/15 1148 146 lb (66.2 kg)     Height 11/18/15 1148 5\' 5"  (1.651 m)     Head Circumference --      Peak Flow --      Pain Score 11/18/15 1149 0     Pain Loc --      Pain Edu? --      Excl. in [place]? --    No data found.   Updated Vital Signs BP 145/81 (BP Location: Left Arm)   Pulse 81   Temp 97.9 F (36.6 C) (  Oral)   Ht 5\' 5"  (1.651 m)   Wt 146 lb (66.2 kg)   SpO2 98%   BMI 24.30 kg/m   Visual Acuity Right Eye Distance:   Left Eye Distance:   Bilateral Distance:    Right Eye Near:   Left Eye Near:    Bilateral Near:     Physical Exam Nursing notes and Vital Signs reviewed. Appearance:  Patient appears stated age, and in no acute distress Eyes:  Pupils are equal, round, and reactive to light and accomodation.  Extraocular movement is intact.  Conjunctivae are not inflamed  Ears:  Canals normal.  Tympanic membranes normal.  Nose:  Mildly congested turbinates.  No sinus tenderness.  Pharynx:  Normal Neck:  Supple.  Tender enlarged posterior/lateral nodes are palpated bilaterally  Lungs:  Clear to auscultation.  Breath sounds are equal.  Moving air well. Heart:  Regular rate and rhythm without murmurs, rubs, or gallops.  Abdomen:  Nontender without masses or hepatosplenomegaly.  Bowel sounds are present.  No CVA or flank tenderness.  Extremities:  No  edema.  Skin:  No rash present.    UC Treatments / Results  Labs (all labs ordered are listed, but only abnormal results are displayed) Labs Reviewed - No data to display  EKG  EKG Interpretation None       Radiology No results found.  Procedures Procedures (including critical care time)  Medications Ordered in UC Medications - No data to display   Initial Impression / Assessment and Plan / UC Course  I have reviewed the triage vital signs and the nursing notes.  Pertinent labs & imaging results that were available during my care of the patient were reviewed by me and considered in my medical decision making (see chart for details).  Clinical Course   Patient may be developing a second viral URI. Begin Z-pak for atypical coverage, and Tussionex at bedtme prn. Take plain guaifenesin (1200mg  extended release tabs such as Mucinex) twice daily, with plenty of water, for cough and congestion.  Get adequate rest.   Also recommend using saline nasal spray several times daily and saline nasal irrigation (AYR is a common brand).   Try warm salt water gargles for sore throat.  Stop all antihistamines for now, and other non-prescription cough/cold preparations. May take Ibuprofen 200mg , 4 tabs every 8 hours with food for chest/sternum discomfort. If cough persists after 10 to 14 days, recommend follow-up with pulmonologist.    Final Clinical Impressions(s) / UC Diagnoses   Final diagnoses:  Recurrent non-productive cough    New Prescriptions New Prescriptions   AZITHROMYCIN (ZITHROMAX Z-PAK) 250 MG TABLET    Take 2 tabs today; then begin one tab once daily for 4 more days.   CHLORPHENIRAMINE-HYDROCODONE (TUSSIONEX PENNKINETIC ER) 10-8 MG/5ML SUER    Take 41mL by mouth HS prn cough     Kandra Nicolas, MD 11/22/15 [phone]

## 2016-01-10 ENCOUNTER — Ambulatory Visit: Payer: Medicare HMO | Admitting: Osteopathic Medicine

## 2016-01-24 ENCOUNTER — Ambulatory Visit (INDEPENDENT_AMBULATORY_CARE_PROVIDER_SITE_OTHER): Payer: Medicare HMO | Admitting: Osteopathic Medicine

## 2016-01-24 ENCOUNTER — Ambulatory Visit (INDEPENDENT_AMBULATORY_CARE_PROVIDER_SITE_OTHER): Payer: Medicare HMO

## 2016-01-24 ENCOUNTER — Encounter: Payer: Self-pay | Admitting: Osteopathic Medicine

## 2016-01-24 VITALS — BP 170/92 | HR 83 | Ht 65.0 in | Wt 150.0 lb

## 2016-01-24 DIAGNOSIS — R0602 Shortness of breath: Secondary | ICD-10-CM | POA: Insufficient documentation

## 2016-01-24 DIAGNOSIS — K219 Gastro-esophageal reflux disease without esophagitis: Secondary | ICD-10-CM

## 2016-01-24 DIAGNOSIS — M8580 Other specified disorders of bone density and structure, unspecified site: Secondary | ICD-10-CM

## 2016-01-24 DIAGNOSIS — E7800 Pure hypercholesterolemia, unspecified: Secondary | ICD-10-CM

## 2016-01-24 DIAGNOSIS — Z780 Asymptomatic menopausal state: Secondary | ICD-10-CM | POA: Diagnosis not present

## 2016-01-24 DIAGNOSIS — R0789 Other chest pain: Secondary | ICD-10-CM

## 2016-01-24 DIAGNOSIS — M545 Low back pain, unspecified: Secondary | ICD-10-CM

## 2016-01-24 DIAGNOSIS — G8929 Other chronic pain: Secondary | ICD-10-CM

## 2016-01-24 HISTORY — DX: Shortness of breath: R06.02

## 2016-01-24 NOTE — Patient Instructions (Signed)
Plan:  For breathing issues:  Plan to return to clinic for pulmonary function test, rather have results of this prior to starting any kind of inhalers but this will help [place] determine if there is any component of asthma/COPD. If shortness of breath dramatically worsens, particularly if you develop fever or productive cough, please seek care ASAP.  EKG today looked fine but we may consider a stress test if all other workup for respiratory issues are negative. If you develop chest pain on exertion, please seek care ASAP.  We'll plan to get routine labs today  For acid reflux:   Can try Tums or Pepto-Bismol as needed for heartburn  Alternatively can try Zantac/ranitidine 300 mg daily every day for heartburn prevention

## 2016-01-24 NOTE — Progress Notes (Signed)
HPI: Sheryl Freeman is a 71 y.o. female  who presents to [facility] [place] today, 01/24/16,  for chief complaint of:  Chief Complaint  Patient presents with  . Establish Care    SOB    Lung concern: . Context: started as viral URI then chest tightness never really resolved . Quality: feels SOB like can't get deep breath . Duration: 2-3 months . Timing: worse with cold, exertion . Modifying factors: previously treated w/ Z-pack, steroids. CXR was normal (records reviewed).  . Assoc signs/symptoms: no chest pain, no orthopnea, no LE edema. No history of asthma, nonsmoker, no other respiratory diagnoses.  Records reviewed:  10/26/2015, seen for complaint of productive cough, chest congestion, fatigue. Abnormal lung sounds noted on exam. Depo-Medrol injection, chest x-ray, nebulizer treatment, given Augmentin  10/29/2015, wheezing again noted on exam, diagnosed with atypical pneumonia, was prescribed level ofloxacin, prednisone,  Elevated BP in office: Patient reports stress due to mother being ill, was in arrest to get here. Previous blood pressures reviewed in care everywhere, 140/84 on 10/29/2015    Other medical Hx/Chronic medical problems reviewed:  Hypercholesterolemia: Patient currently on simvastatin, has been on this for about 2 weeks or so. Records reviewed, basically elevated cholesterol levels in the past. Patient has some concerns about long-term effects of statin. No personal history of MI/TIA/CVA.   GERD: Omeprazole, takes as needed, typically evening meal times. No personal history of gastric ulcer.  Arthritis/low back pain: Takes Celebrex as needed, records reviewed.  Postmenopausal: On Premarin vaginal cream 2-3 times weekly, working well. No bleeding.  Up to date on flu shot, tetanus shot, shingles shot, has had pneumonia vaccine - Pneumovax is documented 08/30/2011, patient reports that she had pneumonia shot November 2017 but I  don't see a record of this.     Past medical, surgical, social and family history reviewed: Patient Active Problem List   Diagnosis Date Noted  . Chronic midline low back pain without sciatica 09/05/2015  . Acne rosacea 08/30/2011  . 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 Hypercholesteremia 08/30/2011  . 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 Osteopenia 08/30/2011  . 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 Vitamin D deficiency 08/30/2011  . Clostridium difficile colitis 07/24/2011   Past Surgical History:  Procedure Laterality Date  . BREAST LUMPECTOMY Right    Social History  Substance Use Topics  . Smoking status: Never Smoker  . Smokeless tobacco: Never Used  . Alcohol use No   Family History  Problem Relation Age of Onset  . Diabetes Father   . Liver disease Father   . Alzheimer's disease Mother      Current medication list and allergy/intolerance information reviewed:   Current Outpatient Prescriptions  Medication Sig Dispense Refill  . celecoxib (CELEBREX) 200 MG capsule Take 1 capsule (200 mg total) by mouth 2 (two) times daily.    Marland Kitchen conjugated estrogens (PREMARIN) vaginal cream Insert  0.5 gram intravaginally and apply topically to vulva two times  per week at bedtime    . simvastatin (ZOCOR) 40 MG tablet Take by mouth.     No current facility-administered medications for this visit.    Allergies  Allergen Reactions  . Codeine Itching      Review of Systems:  Constitutional:  No  fever, no chills, No recent illness, No unintentional weight changes. No significant fatigue.   HEENT: No  headache, no vision change, no hearing change, No sore throat, No  sinus pressure  Cardiac: No  chest pain, No  pressure, No palpitations, No  Orthopnea  Respiratory:  +shortness of breath. +minimal/occasional Cough  Gastrointestinal: No  abdominal pain, No  nausea, No  vomiting,  No  blood in stool, No  diarrhea, No  constipation    Musculoskeletal: No new myalgia/arthralgia  Genitourinary: No  incontinence, No  abnormal genital bleeding, No abnormal genital discharge  Skin: No  Rash, No other wounds/concerning lesions  Hem/Onc: No  easy bruising/bleeding, No  abnormal lymph node  Endocrine: No cold intolerance,  No heat intolerance. No polyuria/polydipsia/polyphagia   Neurologic: No  weakness, No  dizziness, No  slurred speech/focal weakness/facial droop  Psychiatric: No  concerns with depression, No  concerns with anxiety, No sleep problems, No mood problems  Exam:  BP (!) 170/92   Pulse 83   Ht 5\' 5"  (1.651 m)   Wt 150 lb (68 kg)   SpO2 98%   BMI 24.96 kg/m   Constitutional: VS see above. General Appearance: alert, well-developed, well-nourished, NAD  Eyes: Normal lids and conjunctive, non-icteric sclera  Ears, Nose, Mouth, Throat: MMM, Normal external inspection ears/nares/mouth/lips/gums. TM normal bilaterally. Pharynx/tonsils no erythema, no exudate. Nasal mucosa normal.   Neck: No masses, trachea midline. No thyroid enlargement. No tenderness/mass appreciated. No lymphadenopathy  Respiratory: Normal respiratory effort. no wheeze, no rhonchi, no rales  Cardiovascular: S1/S2 normal, no murmur, no rub/gallop auscultated. RRR. No lower extremity edema  Gastrointestinal: Nontender, no masses. No hepatomegaly, no splenomegaly. No hernia appreciated. Bowel sounds normal. Rectal exam deferred.   Musculoskeletal: Gait normal. No clubbing/cyanosis of digits.   Neurological: Normal balance/coordination. No tremor.  Skin: warm, dry, intact. No rash/ulcer.   Psychiatric: Normal judgment/insight. Normal mood and affect. Oriented x3.     Other records reviewed:   08/31/2015 labs, serum creatinine mildly decreased at 1.05 compared to 0.97 about 8 months prior, CBC normal, total cholesterol 318, triglycerides 479, LDL unable to be calculated but previous measurements of LDL 171 and 162.  Chest x-ray  in 10/26/2015: Normal heart size, clear lungs without infiltrate, normal pulmonary vasculature, no pneumothorax.    EKG interpretation: Rate: 63 Rhythm: sinus No ST/T changes concerning for acute ischemia/infarct      ASSESSMENT/PLAN:    Low-risk Wells and Geneva score for PE.   EKG no concerns but consider stress test if no better  RTC for LFT, defer inhaler treatment until further evaluation.   DDx includes asthma, chronic bronchitis, cold or exercise-induced bronchospasm.    Shortness of breath - Plan: CBC with Differential/Platelet, COMPLETE METABOLIC PANEL WITH GFR, TSH, B Nat Peptide, DG Chest 2 View  Hypercholesteremia - We'll hold off on repeat lipid panel for now, patient advised on risks versus benefits of statins  Gastroesophageal reflux disease, esophagitis presence not specified - Advise avoid daily PPI use if possible, trial H2 blocker/as needed antacid  Postmenopausal  Chronic midline low back pain without sciatica  Osteopenia, unspecified location - DEXA 1 year ago    Patient Instructions  Plan:  For breathing issues:  Plan to return to clinic for pulmonary function test, rather have results of this prior to starting any kind of inhalers but this will help [place] determine if there is any component of asthma/COPD. If shortness of breath dramatically worsens, particularly if you develop fever or productive cough, please seek care ASAP.  EKG today looked fine but we may consider a stress test if all other workup for respiratory issues are negative. If you develop chest pain on exertion, please  seek care ASAP.  We'll plan to get routine labs today  For acid reflux:   Can try Tums or Pepto-Bismol as needed for heartburn  Alternatively can try Zantac/ranitidine 300 mg daily every day for heartburn prevention    Visit summary with medication list and pertinent instructions was printed for patient to review. All questions at time of visit were answered -  patient instructed to contact office with any additional concerns. ER/RTC precautions were reviewed with the patient. Follow-up plan: Return in about 1 week (around 01/31/2016) for PULMONARY FUNCTION TEST.  Note: Total time spent 45 minutes, greater than 50% of the visit was spent face-to-face counseling and coordinating care for the following: The primary encounter diagnosis was Shortness of breath. Diagnoses of Hypercholesteremia, Gastroesophageal reflux disease, esophagitis presence not specified, Postmenopausal, Chronic midline low back pain without sciatica, and Osteopenia, unspecified location were also pertinent to this visit.Marland Kitchen

## 2016-01-25 LAB — CBC WITH DIFFERENTIAL/PLATELET
BASOS ABS: 0 {cells}/uL (ref 0–200)
Basophils Relative: 0 %
Eosinophils Absolute: 83 cells/uL (ref 15–500)
Eosinophils Relative: 1 %
HEMATOCRIT: 42.9 % (ref 35.0–45.0)
Hemoglobin: 14.5 g/dL (ref 11.7–15.5)
Lymphocytes Relative: 27 %
Lymphs Abs: 2241 cells/uL (ref 850–3900)
MCH: 31.6 pg (ref 27.0–33.0)
MCHC: 33.8 g/dL (ref 32.0–36.0)
MCV: 93.5 fL (ref 80.0–100.0)
MONO ABS: 498 {cells}/uL (ref 200–950)
MPV: 11.6 fL (ref 7.5–12.5)
Monocytes Relative: 6 %
NEUTROS PCT: 66 %
Neutro Abs: 5478 cells/uL (ref 1500–7800)
Platelets: 248 10*3/uL (ref 140–400)
RBC: 4.59 MIL/uL (ref 3.80–5.10)
RDW: 13.9 % (ref 11.0–15.0)
WBC: 8.3 10*3/uL (ref 3.8–10.8)

## 2016-01-25 LAB — COMPLETE METABOLIC PANEL WITH GFR
ALT: 29 U/L (ref 6–29)
AST: 31 U/L (ref 10–35)
Albumin: 4.7 g/dL (ref 3.6–5.1)
Alkaline Phosphatase: 60 U/L (ref 33–130)
BILIRUBIN TOTAL: 2.1 mg/dL — AB (ref 0.2–1.2)
BUN: 21 mg/dL (ref 7–25)
CHLORIDE: 103 mmol/L (ref 98–110)
CO2: 26 mmol/L (ref 20–31)
CREATININE: 0.94 mg/dL (ref 0.50–0.99)
Calcium: 9.8 mg/dL (ref 8.6–10.4)
GFR, EST AFRICAN AMERICAN: 72 mL/min (ref >=60)
GFR, Est Non African American: 62 mL/min (ref >=60)
Glucose, Bld: 91 mg/dL (ref 65–99)
Potassium: 4 mmol/L (ref 3.5–5.3)
Sodium: 142 mmol/L (ref 135–146)
Total Protein: 7.1 g/dL (ref 6.1–8.1)

## 2016-01-25 LAB — BRAIN NATRIURETIC PEPTIDE: BRAIN NATRIURETIC PEPTIDE: 41.6 pg/mL (ref <100)

## 2016-01-25 LAB — TSH: TSH: 2.54 mIU/L

## 2016-01-27 NOTE — Addendum Note (Signed)
Addended by: Huel Cote on: 01/27/2016 01:28 PM   Modules accepted: Orders

## 2016-01-31 ENCOUNTER — Encounter (INDEPENDENT_AMBULATORY_CARE_PROVIDER_SITE_OTHER): Payer: Self-pay

## 2016-01-31 ENCOUNTER — Ambulatory Visit (INDEPENDENT_AMBULATORY_CARE_PROVIDER_SITE_OTHER): Payer: Medicare HMO | Admitting: Osteopathic Medicine

## 2016-01-31 VITALS — BP 135/63 | HR 85 | Ht 65.0 in | Wt 153.0 lb

## 2016-01-31 DIAGNOSIS — J45909 Unspecified asthma, uncomplicated: Secondary | ICD-10-CM | POA: Diagnosis not present

## 2016-01-31 DIAGNOSIS — R0602 Shortness of breath: Secondary | ICD-10-CM | POA: Diagnosis not present

## 2016-01-31 MED ORDER — ALBUTEROL SULFATE (2.5 MG/3ML) 0.083% IN NEBU
2.5000 mg | INHALATION_SOLUTION | Freq: Once | RESPIRATORY_TRACT | Status: AC
  Administered 2016-01-31: 2.5 mg via RESPIRATORY_TRACT

## 2016-01-31 MED ORDER — FLUTICASONE PROPIONATE HFA 110 MCG/ACT IN AERO: 2.0000 | INHALATION_SPRAY | Freq: Two times a day (BID) | RESPIRATORY_TRACT | 12 refills | Status: DC

## 2016-01-31 MED ORDER — SPACER/AERO CHAMBER MOUTHPIECE MISC: 1 refills | Status: DC

## 2016-01-31 MED ORDER — ALBUTEROL SULFATE HFA 108 (90 BASE) MCG/ACT IN AERS: 1.0000 | INHALATION_SPRAY | RESPIRATORY_TRACT | 11 refills | Status: DC | PRN

## 2016-01-31 NOTE — Progress Notes (Signed)
HPI: Sheryl Freeman is a 71 y.o. female  who presents to [facility] today, 01/31/16,  for chief complaint of:  Chief Complaint  Patient presents with  . Shortness of Breath    Spirometry visit, f/u cough . Context: recently seen for SOB/Cough. Spirometry essentially normal.  . Quality: dry cough, rare scant mucus production . Severity/Timing: Pt states cough has been a lot better since warmer weather.  . Assoc signs/symptoms: SOB when cough is bad    Past medical, surgical, social and family history reviewed: Patient Active Problem List   Diagnosis Date Noted  . Shortness of breath 01/24/2016  . Postmenopausal 01/24/2016  . Gastroesophageal reflux disease 01/24/2016  . Chronic midline low back pain without sciatica 09/05/2015  . Acne rosacea 08/30/2011  . 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 Hypercholesteremia 08/30/2011  . 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 Osteopenia 08/30/2011  . 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 Vitamin D deficiency 08/30/2011  . Clostridium difficile colitis 07/24/2011   Past Surgical History:  Procedure Laterality Date  . BREAST LUMPECTOMY Right    Social History  Substance Use Topics  . Smoking status: Never Smoker  . Smokeless tobacco: Never Used  . Alcohol use No   Family History  Problem Relation Age of Onset  . Diabetes Father   . Liver disease Father   . Alzheimer's disease Mother      Current medication list and allergy/intolerance information reviewed:   Current Outpatient Prescriptions on File Prior to Visit  Medication Sig Dispense Refill  . celecoxib (CELEBREX) 200 MG capsule Take 1 capsule (200 mg total) by mouth 2 (two) times daily.    Marland Kitchen conjugated estrogens (PREMARIN) vaginal cream Insert  0.5 gram intravaginally and apply topically to vulva two times  per week at bedtime    . omeprazole (PRILOSEC) 20 MG capsule Take 20 mg by mouth daily.    .  simvastatin (ZOCOR) 40 MG tablet Take by mouth.     No current facility-administered medications on file prior to visit.    Allergies  Allergen Reactions  . Codeine Itching      Review of Systems:  Constitutional: feels well today  HEENT: No  headache  Cardiac: No  chest pain  Respiratory:  No  shortness of breath. +Cough  Musculoskeletal: No new myalgia/arthralgia  Skin: No  Rash  Exam:  BP 135/63   Pulse 85   Ht 5\' 5"  (1.651 m)   Wt 153 lb (69.4 kg)   SpO2 97%   BMI 25.46 kg/m   Constitutional: VS see above. General Appearance: alert, well-developed, well-nourished, NAD  Eyes: Normal lids and conjunctive, non-icteric sclera  Ears, Nose, Mouth, Throat: MMM, Normal external inspection ears/nares/mouth/lips/gums.  Neck: No masses, trachea midline.   Respiratory: Normal respiratory effort. no wheeze, no rhonchi, no rales  Cardiovascular: S1/S2 normal, no murmur, no rub/gallop auscultated. RRR.   Musculoskeletal: Gait normal. Symmetric and independent movement of all extremities  Neurological: Normal balance/coordination. No tremor.  Skin: warm, dry, intact.   Psychiatric: Normal judgment/insight. Normal mood and affect. Oriented x3.      PFT INTERPRETATION  FEV1/FVC >70 *AND*  FEV1 >80% predicted  = NORMAL SPIROMETRY NORMAL? yes    ASSESSMENT/PLAN:   Suspect cold-induced reactive airway problem given improvement with weather getting warmer.  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 All questions at time of visit were answered - patient instructed to contact office with any additional concerns. ER/RTC precautions were reviewed with the patient. Follow-up plan: Return for annual physical 6 months.

## 2016-02-01 ENCOUNTER — Emergency Department
Admission: EM | Admit: 2016-02-01 | Discharge: 2016-02-01 | Disposition: A | Discharge status: Home / Self Care | Payer: Medicare HMO | Source: Home / Self Care | Attending: Family Medicine | Admitting: Family Medicine

## 2016-02-01 ENCOUNTER — Encounter: Payer: Self-pay | Admitting: Emergency Medicine

## 2016-02-01 DIAGNOSIS — R358 Other polyuria: Secondary | ICD-10-CM | POA: Diagnosis not present

## 2016-02-01 DIAGNOSIS — J45909 Unspecified asthma, uncomplicated: Secondary | ICD-10-CM | POA: Insufficient documentation

## 2016-02-01 DIAGNOSIS — N3090 Cystitis, unspecified without hematuria: Secondary | ICD-10-CM

## 2016-02-01 DIAGNOSIS — R822 Biliuria: Secondary | ICD-10-CM

## 2016-02-01 DIAGNOSIS — R3589 Other polyuria: Secondary | ICD-10-CM

## 2016-02-01 LAB — COMPLETE METABOLIC PANEL WITH GFR
ALBUMIN: 4.2 g/dL (ref 3.6–5.1)
ALK PHOS: 65 U/L (ref 33–130)
ALT: 36 U/L — ABNORMAL HIGH (ref 6–29)
AST: 35 U/L (ref 10–35)
BILIRUBIN TOTAL: 1.7 mg/dL — AB (ref 0.2–1.2)
BUN: 19 mg/dL (ref 7–25)
CO2: 24 mmol/L (ref 20–31)
Calcium: 10.1 mg/dL (ref 8.6–10.4)
Chloride: 105 mmol/L (ref 98–110)
Creat: 0.93 mg/dL (ref 0.50–0.99)
GFR, EST AFRICAN AMERICAN: 73 mL/min (ref >=60)
GFR, EST NON AFRICAN AMERICAN: 63 mL/min (ref >=60)
Glucose, Bld: 107 mg/dL — ABNORMAL HIGH (ref 65–99)
Potassium: 4.6 mmol/L (ref 3.5–5.3)
Sodium: 140 mmol/L (ref 135–146)
TOTAL PROTEIN: 7 g/dL (ref 6.1–8.1)

## 2016-02-01 LAB — CBC WITH DIFFERENTIAL/PLATELET
Basophils Absolute: 0 cells/uL (ref 0–200)
Basophils Relative: 0 %
EOS PCT: 1 %
Eosinophils Absolute: 106 cells/uL (ref 15–500)
HCT: 41.7 % (ref 35.0–45.0)
Hemoglobin: 14.1 g/dL (ref 11.7–15.5)
Lymphocytes Relative: 16 %
Lymphs Abs: 1696 cells/uL (ref 850–3900)
MCH: 31.5 pg (ref 27.0–33.0)
MCHC: 33.8 g/dL (ref 32.0–36.0)
MCV: 93.1 fL (ref 80.0–100.0)
MONOS PCT: 7 %
MPV: 11.3 fL (ref 7.5–12.5)
Monocytes Absolute: 742 cells/uL (ref 200–950)
NEUTROS ABS: 8056 {cells}/uL — AB (ref 1500–7800)
Neutrophils Relative %: 76 %
PLATELETS: 242 10*3/uL (ref 140–400)
RBC: 4.48 MIL/uL (ref 3.80–5.10)
RDW: 13.7 % (ref 11.0–15.0)
WBC: 10.6 10*3/uL (ref 3.8–10.8)

## 2016-02-01 LAB — POCT URINALYSIS DIP (MANUAL ENTRY)
GLUCOSE UA: NEGATIVE
Nitrite, UA: POSITIVE — AB
Protein Ur, POC: >=300 — AB
Spec Grav, UA: 1.025 (ref 1.005–1.03)
Urobilinogen, UA: 1 (ref 0–1)
pH, UA: 5.5 (ref 5–8)

## 2016-02-01 MED ORDER — CEPHALEXIN 500 MG PO CAPS: 500.0000 mg | ORAL_CAPSULE | Freq: Two times a day (BID) | ORAL | 0 refills | Status: DC

## 2016-02-01 NOTE — ED Provider Notes (Signed)
[facility]    CSN: [id number] Arrival date & time: 02/01/16  0807     History   Chief Complaint Chief Complaint  Patient presents with  . Hematuria    HPI Sheryl Freeman is a 71 y.o. female.   Patient complains of mild dysuria and frequency for 3 days.  Today she noted hematuria.  She feels well otherwise.  No fevers, chills, and sweats.  No nausea/vomiting.  No abdominal pain.   The history is provided by the patient.  Hematuria  This is a new problem. The current episode started 1 to 2 hours ago. The problem occurs constantly. The problem has not changed since onset.Pertinent negatives include no abdominal pain. Nothing aggravates the symptoms. Nothing relieves the symptoms. She has tried nothing for the symptoms.    Past Medical History:  Diagnosis Date  . High cholesterol     Patient Active Problem List   Diagnosis Date Noted  . 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 Shortness of breath 01/24/2016  . Postmenopausal 01/24/2016  . Gastroesophageal reflux disease 01/24/2016  . Chronic midline low back pain without sciatica 09/05/2015  . Acne rosacea 08/30/2011  . 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 Hypercholesteremia 08/30/2011  . 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 Osteopenia 08/30/2011  . 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 Vitamin D deficiency 08/30/2011  . Clostridium difficile colitis 07/24/2011    Past Surgical History:  Procedure Laterality Date  . BREAST LUMPECTOMY Right     OB History    No data available       Home Medications    Prior to Admission medications   Medication Sig Start Date End Date Taking? Authorizing Provider  cephALEXin (KEFLEX) 500 MG capsule Take 1 capsule (500 mg total) by mouth 2 (two) times daily. 02/01/16   Kandra Nicolas, MD  omeprazole (PRILOSEC) 20 MG capsule Take 20 mg by mouth daily.    Historical Provider, MD  simvastatin (ZOCOR) 40 MG tablet  Take by mouth. 10/07/15 10/06/16  Historical Provider, MD  Spacer/Aero Chamber Mouthpiece MISC Use with inhalers as directed 01/31/16   Emeterio Reeve, DO    Family History Family History  Problem Relation Age of Onset  . Diabetes Father   . Liver disease Father   . Alzheimer's disease Mother     Social History Social History  Substance Use Topics  . Smoking status: Never Smoker  . Smokeless tobacco: Never Used  . Alcohol use No     Allergies   Codeine   Review of Systems Review of Systems  Constitutional: Negative for activity change, appetite change, chills, diaphoresis, fatigue and fever.  Gastrointestinal: Negative for abdominal pain, nausea and vomiting.  Genitourinary: Positive for dysuria, frequency, hematuria and urgency. Negative for flank pain.  Musculoskeletal: Positive for back pain.  All other systems reviewed and are negative.    Physical Exam Triage Vital Signs ED Triage Vitals  Enc Vitals Group     BP 02/01/16 0828 141/72     Pulse Rate 02/01/16 0828 77     Resp --      Temp 02/01/16 0828 97.7 F (36.5 C)     Temp Source 02/01/16 0828 Oral     SpO2 02/01/16 0828 96 %     Weight 02/01/16 0828 151 lb (68.5 kg)     Height 02/01/16 0828 5\' 5"  (1.651 m)     Head Circumference --      Peak Flow --  Pain Score 02/01/16 0830 4     Pain Loc --      Pain Edu? --      Excl. in [place]? --    No data found.   Updated Vital Signs BP 141/72 (BP Location: Left Arm)   Pulse 77   Temp 97.7 F (36.5 C) (Oral)   Ht 5\' 5"  (1.651 m)   Wt 151 lb (68.5 kg)   SpO2 96%   BMI 25.13 kg/m   Visual Acuity Right Eye Distance:   Left Eye Distance:   Bilateral Distance:    Right Eye Near:   Left Eye Near:    Bilateral Near:     Physical Exam Nursing notes and Vital Signs reviewed. Appearance:  Patient appears stated age, and in no acute distress.    Eyes:  Pupils are equal, round, and reactive to light and accomodation.  Extraocular movement is intact.   Conjunctivae are not inflamed   Pharynx:  Normal; moist mucous membranes  Neck:  Supple.  No adenopathy Lungs:  Clear to auscultation.  Breath sounds are equal.  Moving air well. Heart:  Regular rate and rhythm without murmurs, rubs, or gallops.  Abdomen:  Mild tenderness beneath right costal margin without masses or hepatosplenomegaly.  Bowel sounds are present.  No CVA or flank tenderness.  Extremities:  No edema.  Skin:  No rash present.     UC Treatments / Results  Labs (all labs ordered are listed, but only abnormal results are displayed) Labs Reviewed  POCT URINALYSIS DIP (MANUAL ENTRY) - Abnormal; Notable for the following:       Result Value   Color, UA red (*)    Clarity, UA cloudy (*)    Bilirubin, UA large (*)    Ketones, POC UA small (15) (*)    Blood, UA large (*)    Protein Ur, POC >=300 (*)    Nitrite, UA Positive (*)    Leukocytes, UA large (3+) (*)    All other components within normal limits  URINE CULTURE  COMPLETE METABOLIC PANEL WITH GFR  POCT CBC W AUTO DIFF ([facility])    EKG  EKG Interpretation None       Radiology No results found.  Procedures Procedures (including critical care time)  Medications Ordered in UC Medications - No data to display   Initial Impression / Assessment and Plan / UC Course  I have reviewed the triage vital signs and the nursing notes.  Pertinent labs & imaging results that were available during my care of the patient were reviewed by me and considered in my medical decision making (see chart for details).    Because of Bilirubinuria, will check CMP and CBC Urine culture pending. Begin Keflex 500mg  BID for one week. Increase fluid intake. May use non-prescription AZO for about two days, if desired, to decrease urinary discomfort.  If symptoms become significantly worse during the night or over the weekend, proceed to the local emergency room.  Followup with Family Doctor if not improved in one week.       Final Clinical Impressions(s) / UC Diagnoses   Final diagnoses:  Polyuria  Cystitis  Bilirubinuria    New Prescriptions New Prescriptions   CEPHALEXIN (KEFLEX) 500 MG CAPSULE    Take 1 capsule (500 mg total) by mouth 2 (two) times daily.     Kandra Nicolas, MD 02/01/16 [phone]

## 2016-02-01 NOTE — Discharge Instructions (Signed)
Increase fluid intake. May use non-prescription AZO for about two days, if desired, to decrease urinary discomfort.  If symptoms become significantly worse during the night or over the weekend, proceed to the local emergency room.  

## 2016-02-01 NOTE — ED Triage Notes (Signed)
Hematuria, polyuria started yesterday

## 2016-02-02 ENCOUNTER — Telehealth: Payer: Self-pay | Admitting: Emergency Medicine

## 2016-02-02 NOTE — Telephone Encounter (Signed)
Blood work is consistent with [facility] testing, hope you are feeling better. Call with questions or concerns

## 2016-02-03 LAB — URINE CULTURE

## 2016-04-25 ENCOUNTER — Telehealth: Payer: Self-pay | Admitting: *Deleted

## 2016-04-25 ENCOUNTER — Emergency Department
Admission: EM | Admit: 2016-04-25 | Discharge: 2016-04-25 | Disposition: A | Discharge status: Home / Self Care | Payer: Medicare HMO | Source: Home / Self Care | Attending: Family Medicine | Admitting: Family Medicine

## 2016-04-25 ENCOUNTER — Encounter: Payer: Self-pay | Admitting: *Deleted

## 2016-04-25 DIAGNOSIS — N3090 Cystitis, unspecified without hematuria: Secondary | ICD-10-CM

## 2016-04-25 DIAGNOSIS — R300 Dysuria: Secondary | ICD-10-CM

## 2016-04-25 HISTORY — DX: Unspecified asthma, uncomplicated: J45.909

## 2016-04-25 LAB — POCT URINALYSIS DIP (MANUAL ENTRY)
Bilirubin, UA: NEGATIVE
Blood, UA: NEGATIVE
GLUCOSE UA: NEGATIVE mg/dL
Ketones, POC UA: NEGATIVE mg/dL
Nitrite, UA: NEGATIVE
PROTEIN UA: NEGATIVE mg/dL
Spec Grav, UA: <=1.005 — AB (ref 1.010–1.025)
Urobilinogen, UA: 0.2 E.U./dL
pH, UA: 6 (ref 5.0–8.0)

## 2016-04-25 MED ORDER — NITROFURANTOIN MONOHYD MACRO 100 MG PO CAPS: 100.0000 mg | ORAL_CAPSULE | Freq: Two times a day (BID) | ORAL | 0 refills | Status: DC

## 2016-04-25 NOTE — ED Provider Notes (Signed)
[facility]    CSN: [id number] Arrival date & time: 04/25/16  1159     History   Chief Complaint Chief Complaint  Patient presents with  . Dysuria    HPI Sheryl Freeman is a 71 y.o. female.   Patient was on a car trip yesterday, driving for 8 hours.  Today she awoke with lower abdominal pressure discomfort, dysuria, and chills.  No fever.    Dysuria  Pain quality:  Burning Pain severity:  Mild Onset quality:  Sudden Duration:  5 hours Timing:  Constant Progression:  Worsening Chronicity:  Recurrent Recent urinary tract infections: no   Relieved by:  Phenazopyridine Worsened by:  Nothing Urinary symptoms: frequent urination and hesitancy   Urinary symptoms: no discolored urine, no foul-smelling urine, no hematuria and no bladder incontinence   Associated symptoms: abdominal pain   Associated symptoms: no fever, no flank pain and no vomiting     Past Medical History:  Diagnosis Date  . Asthma   . High cholesterol     Patient Active Problem List   Diagnosis Date Noted  . 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 Shortness of breath 01/24/2016  . Postmenopausal 01/24/2016  . Gastroesophageal reflux disease 01/24/2016  . Chronic midline low back pain without sciatica 09/05/2015  . Acne rosacea 08/30/2011  . 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 Hypercholesteremia 08/30/2011  . 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 Osteopenia 08/30/2011  . 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 Vitamin D deficiency 08/30/2011  . Clostridium difficile colitis 07/24/2011    Past Surgical History:  Procedure Laterality Date  . BREAST LUMPECTOMY Right     OB History    No data available       Home Medications    Prior to Admission medications   Medication Sig Start Date End Date Taking? Authorizing Provider  albuterol (PROVENTIL HFA;VENTOLIN HFA) 108 (90 Base) MCG/ACT inhaler Inhale into the lungs every 6  (six) hours as needed for wheezing or shortness of breath.   Yes Historical Provider, MD  fluticasone (FLOVENT HFA) 110 MCG/ACT inhaler Inhale into the lungs 2 (two) times daily.   Yes Historical Provider, MD  nitrofurantoin, macrocrystal-monohydrate, (MACROBID) 100 MG capsule Take 1 capsule (100 mg total) by mouth 2 (two) times daily. Take with food. 04/25/16   Kandra Nicolas, MD  omeprazole (PRILOSEC) 20 MG capsule Take 20 mg by mouth daily.    Historical Provider, MD  simvastatin (ZOCOR) 40 MG tablet Take by mouth. 10/07/15 10/06/16  Historical Provider, MD  Spacer/Aero Chamber Mouthpiece MISC Use with inhalers as directed 01/31/16   Emeterio Reeve, DO    Family History Family History  Problem Relation Age of Onset  . Diabetes Father   . Liver disease Father   . Alzheimer's disease Mother     Social History Social History  Substance Use Topics  . Smoking status: Never Smoker  . Smokeless tobacco: Never Used  . Alcohol use No     Allergies   Codeine   Review of Systems Review of Systems  Constitutional: Negative for fever.  Gastrointestinal: Positive for abdominal pain. Negative for vomiting.  Genitourinary: Positive for dysuria. Negative for flank pain.  All other systems reviewed and are negative.    Physical Exam Triage Vital Signs ED Triage Vitals  Enc Vitals Group     BP 04/25/16 1231 124/74     Pulse Rate 04/25/16 1231 76     Resp 04/25/16 1231 16  Temp 04/25/16 1231 97.6 F (36.4 C)     Temp Source 04/25/16 1231 Oral     SpO2 04/25/16 1231 98 %     Weight 04/25/16 1232 155 lb (70.3 kg)     Height 04/25/16 1232 5\' 4"  (1.626 m)     Head Circumference --      Peak Flow --      Pain Score 04/25/16 1232 0     Pain Loc --      Pain Edu? --      Excl. in [place]? --    No data found.   Updated Vital Signs BP 124/74 (BP Location: Left Arm)   Pulse 76   Temp 97.6 F (36.4 C) (Oral)   Resp 16   Ht 5\' 4"  (1.626 m)   Wt 155 lb (70.3 kg)   SpO2 98%    BMI 26.61 kg/m   Visual Acuity Right Eye Distance:   Left Eye Distance:   Bilateral Distance:    Right Eye Near:   Left Eye Near:    Bilateral Near:     Physical Exam Nursing notes and Vital Signs reviewed. Appearance:  Patient appears stated age, and in no acute distress.    Eyes:  Pupils are equal, round, and reactive to light and accomodation.  Extraocular movement is intact.  Conjunctivae are not inflamed   Pharynx:  Normal; moist mucous membranes  Neck:  Supple.  No adenopathy Lungs:  Clear to auscultation.  Breath sounds are equal.  Moving air well. Heart:  Regular rate and rhythm without murmurs, rubs, or gallops.  Abdomen:   Mild tenderness over bladder without masses or hepatosplenomegaly.  Bowel sounds are present.  No CVA or flank tenderness.  Extremities:  No edema.  Skin:  No rash present.     UC Treatments / Results  Labs (all labs ordered are listed, but only abnormal results are displayed) Labs Reviewed  POCT URINALYSIS DIP (MANUAL ENTRY) - Abnormal; Notable for the following:       Result Value   Spec Grav, UA <=1.005 (*)    Leukocytes, UA Small (1+) (*)    All other components within normal limits  URINE CULTURE    EKG  EKG Interpretation None       Radiology No results found.  Procedures Procedures (including critical care time)  Medications Ordered in UC Medications - No data to display   Initial Impression / Assessment and Plan / UC Course  I have reviewed the triage vital signs and the nursing notes.  Pertinent labs & imaging results that were available during my care of the patient were reviewed by me and considered in my medical decision making (see chart for details).    Urine culture pending. Begin Macrobid 100mg  BID for one week. Increase fluid intake. If symptoms become significantly worse during the night or over the weekend, proceed to the local emergency room.  Followup with Family Doctor if not improved in one week.      Final Clinical Impressions(s) / UC Diagnoses   Final diagnoses:  Dysuria  Cystitis    New Prescriptions New Prescriptions   NITROFURANTOIN, MACROCRYSTAL-MONOHYDRATE, (MACROBID) 100 MG CAPSULE    Take 1 capsule (100 mg total) by mouth 2 (two) times daily. Take with food.     Kandra Nicolas, MD 04/25/16 [phone]

## 2016-04-25 NOTE — Discharge Instructions (Signed)
Increase fluid intake. May use non-prescription AZO for about two days, if desired, to decrease urinary discomfort.  If symptoms become significantly worse during the night or over the weekend, proceed to the local emergency room.  

## 2016-04-25 NOTE — Telephone Encounter (Signed)
Encounter created to enter Ucx order and result not entered on DOS.

## 2016-04-25 NOTE — ED Triage Notes (Signed)
Pt c/o dysuria, lower abd pressure, LBP and urinary urgency x today. Denies fever.

## 2016-04-27 LAB — URINE CULTURE

## 2016-04-28 ENCOUNTER — Telehealth: Payer: Self-pay | Admitting: Emergency Medicine

## 2016-04-28 NOTE — Telephone Encounter (Signed)
Patient informed of Negative Results. Advised to call back with questions or concerns. AP, CMA

## 2016-05-02 ENCOUNTER — Telehealth: Payer: Self-pay

## 2016-05-02 DIAGNOSIS — R0602 Shortness of breath: Secondary | ICD-10-CM

## 2016-05-02 DIAGNOSIS — J45909 Unspecified asthma, uncomplicated: Secondary | ICD-10-CM

## 2016-05-02 NOTE — Telephone Encounter (Signed)
Patient notified

## 2016-05-02 NOTE — Telephone Encounter (Signed)
Referral to pulmonology is placed, can call patient and let her know to expect a call from them, please alert [place] if she does not hear back from them within 1 week

## 2016-05-02 NOTE — Telephone Encounter (Signed)
Patient called stated that she had spirometry done and her Flovent and ventolin is not working she is having a hard time breathing. Patient would like a referral to see a Pulmonologist at this point. Please advise. Sheryl Freeman,CMA

## 2016-05-19 ENCOUNTER — Emergency Department
Admission: EM | Admit: 2016-05-19 | Discharge: 2016-05-19 | Disposition: A | Discharge status: Home / Self Care | Payer: Medicare HMO | Source: Home / Self Care | Attending: Family Medicine | Admitting: Family Medicine

## 2016-05-19 ENCOUNTER — Encounter: Payer: Self-pay | Admitting: Emergency Medicine

## 2016-05-19 DIAGNOSIS — N3090 Cystitis, unspecified without hematuria: Secondary | ICD-10-CM | POA: Diagnosis not present

## 2016-05-19 HISTORY — DX: Urinary tract infection, site not specified: N39.0

## 2016-05-19 LAB — POCT URINALYSIS DIP (MANUAL ENTRY)
BILIRUBIN UA: NEGATIVE
BILIRUBIN UA: NEGATIVE mg/dL
GLUCOSE UA: NEGATIVE mg/dL
Nitrite, UA: NEGATIVE
PROTEIN UA: NEGATIVE mg/dL
SPEC GRAV UA: 1.015 (ref 1.010–1.025)
Urobilinogen, UA: 0.2 E.U./dL
pH, UA: 6 (ref 5.0–8.0)

## 2016-05-19 MED ORDER — LEVOFLOXACIN 500 MG PO TABS: 500.0000 mg | ORAL_TABLET | Freq: Every day | ORAL | 0 refills | Status: DC

## 2016-05-19 NOTE — ED Triage Notes (Signed)
Awoke today with noticing odor of urine abnormal, with some burning as day went on. Traveling next week and wants treatment if necessary.

## 2016-05-19 NOTE — Discharge Instructions (Signed)
Continue increased fluid intake.  May use non-prescription AZO for about two days, if desired, to decrease urinary discomfort.   If symptoms become significantly worse during the night or over the weekend, proceed to the local emergency room.  

## 2016-05-19 NOTE — ED Provider Notes (Signed)
[facility]    CSN: [id number] Arrival date & time: 05/19/16  1346     History   Chief Complaint Chief Complaint  Patient presents with  . Dysuria    HPI Sheryl Freeman is a 71 y.o. female.   Patient awoke today and noticed malodorous urine, and later developed mild dysuria and urgency.  She feels well otherwise.  She reports that she will be travelling next week.  She notes that recent UTI's seem to recur after taking Keflex and Macrobid.   Review of a urine culture done 02/01/16 revealed e. Coli sensitive to cipro.   The history is provided by the patient.  Dysuria  Pain quality:  Burning Pain severity:  Mild Onset quality:  Sudden Duration:  6 hours Timing:  Constant Progression:  Worsening Chronicity:  Recurrent Recent urinary tract infections: yes   Relieved by:  None tried Worsened by:  Nothing Ineffective treatments:  None tried Urinary symptoms: foul-smelling urine, frequent urination and hesitancy   Urinary symptoms: no discolored urine, no hematuria and no bladder incontinence   Associated symptoms: no abdominal pain, no fever, no flank pain, no genital lesions, no nausea and no vaginal discharge   Risk factors: recurrent urinary tract infections     Past Medical History:  Diagnosis Date  . Asthma   . Frequent UTI   . High cholesterol     Patient Active Problem List   Diagnosis Date Noted  . 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 Shortness of breath 01/24/2016  . Postmenopausal 01/24/2016  . Gastroesophageal reflux disease 01/24/2016  . Chronic midline low back pain without sciatica 09/05/2015  . Acne rosacea 08/30/2011  . 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 Hypercholesteremia 08/30/2011  . 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 Osteopenia 08/30/2011  . 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 Vitamin D deficiency 08/30/2011  . Clostridium difficile colitis 07/24/2011    Past Surgical  History:  Procedure Laterality Date  . BREAST LUMPECTOMY Right     OB History    No data available       Home Medications    Prior to Admission medications   Medication Sig Start Date End Date Taking? Authorizing Provider  albuterol (PROVENTIL HFA;VENTOLIN HFA) 108 (90 Base) MCG/ACT inhaler Inhale into the lungs every 6 (six) hours as needed for wheezing or shortness of breath.    [provider]  fluticasone (FLOVENT HFA) 110 MCG/ACT inhaler Inhale into the lungs 2 (two) times daily.    [provider]  levofloxacin (LEVAQUIN) 500 MG tablet Take 1 tablet (500 mg total) by mouth daily. 05/19/16   Kandra Nicolas, MD  omeprazole (PRILOSEC) 20 MG capsule Take 20 mg by mouth daily.    [provider]  simvastatin (ZOCOR) 40 MG tablet Take by mouth. 10/07/15 10/06/16  [provider]  Spacer/Aero Chamber Mouthpiece MISC Use with inhalers as directed 01/31/16   Emeterio Reeve, DO    Family History Family History  Problem Relation Age of Onset  . Diabetes Father   . Liver disease Father   . Alzheimer's disease Mother     Social History Social History  Substance Use Topics  . Smoking status: Never Smoker  . Smokeless tobacco: Never Used  . Alcohol use No     Allergies   Codeine   Review of Systems Review of Systems  Constitutional: Negative for fever.  Gastrointestinal: Negative for abdominal pain and nausea.  Genitourinary: Positive for dysuria. Negative  for flank pain and vaginal discharge.  All other systems reviewed and are negative.    Physical Exam Triage Vital Signs ED Triage Vitals  Enc Vitals Group     BP 05/19/16 1404 119/72     Pulse Rate 05/19/16 1404 69     Resp 05/19/16 1404 16     Temp 05/19/16 1404 97.7 F (36.5 C)     Temp Source 05/19/16 1404 Oral     SpO2 05/19/16 1404 94 %     Weight 05/19/16 1405 150 lb (68 kg)     Height 05/19/16 1405 5\' 5"  (1.651 m)     Head Circumference --      Peak Flow --       Pain Score 05/19/16 1405 1     Pain Loc --      Pain Edu? --      Excl. in [place]? --    No data found.   Updated Vital Signs BP 119/72 (BP Location: Left Arm)   Pulse 69   Temp 97.7 F (36.5 C) (Oral)   Resp 16   Ht 5\' 5"  (1.651 m)   Wt 150 lb (68 kg)   SpO2 94%   BMI 24.96 kg/m   Visual Acuity Right Eye Distance:   Left Eye Distance:   Bilateral Distance:    Right Eye Near:   Left Eye Near:    Bilateral Near:     Physical Exam Nursing notes and Vital Signs reviewed. Appearance:  Patient appears stated age, and in no acute distress.    Eyes:  Pupils are equal, round, and reactive to light and accomodation.  Extraocular movement is intact.  Conjunctivae are not inflamed   Pharynx:  Normal; moist mucous membranes  Neck:  Supple.  No adenopathy Lungs:  Clear to auscultation.  Breath sounds are equal.  Moving air well. Heart:  Regular rate and rhythm without murmurs, rubs, or gallops.  Abdomen:  Nontender without masses or hepatosplenomegaly.  Bowel sounds are present.  No CVA or flank tenderness.  Extremities:  No edema.  Skin:  No rash present.     UC Treatments / Results  Labs (all labs ordered are listed, but only abnormal results are displayed) Labs Reviewed  POCT URINALYSIS DIP (MANUAL ENTRY) - Abnormal; Notable for the following:       Result Value   Color, UA light yellow (*)    Clarity, UA cloudy (*)    Blood, UA trace-intact (*)    Leukocytes, UA Moderate (2+) (*)    All other components within normal limits  URINE CULTURE    EKG  EKG Interpretation None       Radiology No results found.  Procedures Procedures (including critical care time)  Medications Ordered in UC Medications - No data to display   Initial Impression / Assessment and Plan / UC Course  I have reviewed the triage vital signs and the nursing notes.  Pertinent labs & imaging results that were available during my care of the patient were reviewed by me and considered in my  medical decision making (see chart for details).    Urine culture pending. Because patient has had several recurrent UTI's after taking Keflex and Macrobid, will try Levaquin. Begin Levaquin 500mg  daily for 7 days. Continue increased fluid intake. May use non-prescription AZO for about two days, if desired, to decrease urinary discomfort.  If symptoms become significantly worse during the night or over the weekend, proceed to the local emergency room.  Followup with Family Doctor if not improved in one week.     Final Clinical Impressions(s) / UC Diagnoses   Final diagnoses:  Cystitis    New Prescriptions New Prescriptions   LEVOFLOXACIN (LEVAQUIN) 500 MG TABLET    Take 1 tablet (500 mg total) by mouth daily.     Kandra Nicolas, MD 05/29/16 [phone]

## 2016-05-23 ENCOUNTER — Telehealth: Payer: Self-pay | Admitting: *Deleted

## 2016-05-23 LAB — URINE CULTURE

## 2016-05-23 NOTE — Telephone Encounter (Signed)
LM with Ucx results and to call back if she has any questions or concerns.  

## 2016-06-12 ENCOUNTER — Ambulatory Visit (INDEPENDENT_AMBULATORY_CARE_PROVIDER_SITE_OTHER)
Admission: RE | Admit: 2016-06-12 | Discharge: 2016-06-12 | Disposition: A | Discharge status: Home / Self Care | Payer: Medicare HMO | Source: Ambulatory Visit | Attending: Internal Medicine | Admitting: Internal Medicine

## 2016-06-12 ENCOUNTER — Encounter: Payer: Self-pay | Admitting: Internal Medicine

## 2016-06-12 ENCOUNTER — Ambulatory Visit (INDEPENDENT_AMBULATORY_CARE_PROVIDER_SITE_OTHER): Payer: Medicare HMO | Admitting: Internal Medicine

## 2016-06-12 VITALS — BP 140/80 | HR 69 | Ht 65.0 in | Wt 147.0 lb

## 2016-06-12 DIAGNOSIS — R058 Other specified cough: Secondary | ICD-10-CM

## 2016-06-12 DIAGNOSIS — R05 Cough: Secondary | ICD-10-CM | POA: Diagnosis not present

## 2016-06-12 DIAGNOSIS — J45991 Cough variant asthma: Secondary | ICD-10-CM | POA: Diagnosis not present

## 2016-06-12 MED ORDER — BENZONATATE 200 MG PO CAPS: 200.0000 mg | ORAL_CAPSULE | Freq: Three times a day (TID) | ORAL | 1 refills | Status: DC | PRN

## 2016-06-12 MED ORDER — PREDNISONE 10 MG PO TABS: ORAL_TABLET | ORAL | 0 refills | Status: DC

## 2016-06-12 MED ORDER — PANTOPRAZOLE SODIUM 40 MG PO TBEC: 40.0000 mg | DELAYED_RELEASE_TABLET | Freq: Every day | ORAL | 2 refills | Status: DC

## 2016-06-12 NOTE — Progress Notes (Signed)
Subjective:     Patient ID: Sheryl Freeman, female   DOB: 1945-09-01,    MRN: [id number]  HPI  34 yowf never smoker with "whooping cough" as infant but healthy childhood in [place] and good tolerance and did fine until in her late 34s noted with typical colds in winter would last longer than her peers up to 6 weeks at a time assoc with sob  best rx was narcotic containing cough meds but then Oct 2017 had recurrent cough thatcame on like the other episodes and never let up so referred to pulmonary clinic 06/12/2016 by Dr   Sheppard Coil    06/12/2016 1st [facility] office visit/ Sheryl Freeman   Chief Complaint  Patient presents with  . Pulmonary Consult    Referred by Dr. Emeterio Reeve. [name] for the past 8 months. She states she is SOB when the weather is damp and cold and also when she gets stressed. She also c/o occ non prod cough. She is using an albuterol inhaler 1x daily on average.   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                            With trouble breathing? Rarely never   Do you have choking episodes when you cannot  Get enough air, gasping for air ?              Yes def    Do you usually cough when you lie down into  The bed, or when you just lie down to rest ?                          Sometimes not every noct    Do you usually cough after meals or eating?         Yes   Do you cough when (or after) you bend over?    sometimes   GERD SCORE     Kouffman Reflux v Neurogenic Cough Differentiator Neurogenic   Do you more-or-less cough all day long? yes   Does  change of temperature make you cough? Winter yes   Does laughing or chuckling cause you to cough? no   Do fumes (perfume, automobile fumes, burned  Toast, etc.,) cause you to cough ?      no   Does speaking, singing, or talking on the phone cause you to cough   ?               Sometimes    Neurogenic/Airway score      Unless coughing really  Not limited by breathing from desired activities    No obvious other patterns in day to day or daytime variability or assoc excess/ purulent sputum or mucus plugs or hemoptysis or cp or chest tightness, subjective wheeze or overt sinus   symptoms. No unusual exp hx or h/o childhood   asthma or knowledge of premature birth.  Sleeping  ok without nocturnal  or early am exacerbation  of respiratory  c/o's or need for noct saba. Also denies any obvious fluctuation of symptoms with weather or environmental changes or other aggravating or alleviating factors except as outlined above   Current Medications, Allergies, Complete Past Medical History, Past Surgical History, Family History, and Social History were reviewed in [employer] record.  ROS  The following are not active complaints unless bolded sore throat, dysphagia, dental problems, itching, sneezing,  nasal congestion or excess/ purulent secretions, ear ache,   fever, chills, sweats, unintended wt loss, classically pleuritic or exertional cp,  orthopnea pnd or leg swelling, presyncope, palpitations, abdominal pain, anorexia, nausea, vomiting, diarrhea  or change in bowel or bladder habits, change in stools or urine, dysuria,hematuria,  rash, arthralgias, visual complaints, headache, numbness, weakness or ataxia or problems with walking or coordination,  change in mood/affect or memory.        Review of Systems     Objective:   Physical Exam    very pleasant amb wf freq throat clearing and hoarseness   Wt Readings from Last 3 Encounters:  06/12/16 147 lb (66.7 kg)  05/19/16 150  lb (68 kg)  04/25/16 155 lb (70.3 kg)    Vital signs reviewed - Note on arrival 02 sats  96% on RA      HEENT: nl dentition, turbinates bilaterally, and oropharynx. Nl external ear canals without cough reflex   NECK :  without JVD/Nodes/TM/ nl carotid upstrokes bilaterally   LUNGS: no acc muscle use,  Nl contour chest which is clear to A and P bilaterally with urge to cough blowing out    CV:  RRR  no s3 or murmur or increase in P2, and no edema   ABD:  soft and nontender with nl inspiratory excursion in the supine position. No bruits or organomegaly appreciated, bowel sounds nl  MS:  Nl gait/ ext warm without deformities, calf tenderness, cyanosis or clubbing No obvious joint restrictions   SKIN: warm and dry without lesions    NEURO:  alert, approp, nl sensorium with  no motor or cerebellar deficits apparent.    CXR PA and Lateral:   06/12/2016 :    I personally reviewed images and agree with radiology impression as follows:   No active cardiopulmonary disease.   Assessment:

## 2016-06-12 NOTE — Assessment & Plan Note (Addendum)
Flare since 10/2015 - max rx for GERD 06/12/2016 >>>   The most common causes of chronic cough in immunocompetent adults include the following: upper airway cough syndrome (UACS), previously referred to as postnasal drip syndrome (PNDS), which is caused by variety of rhinosinus conditions; (2) asthma; (3) GERD; (4) chronic bronchitis from cigarette smoking or other inhaled environmental irritants; (5) nonasthmatic eosinophilic bronchitis; and (6) bronchiectasis.   These conditions, singly or in combination, have accounted for up to 94% of the causes of chronic cough in prospective studies.   Other conditions have constituted no >6% of the causes in prospective studies These have included bronchogenic carcinoma, chronic interstitial pneumonia, sarcoidosis, left ventricular failure, ACEI-induced cough, and aspiration from a condition associated with pharyngeal dysfunction.    Chronic cough is often simultaneously caused by more than one condition. A single cause has been found from 38 to 82% of the time, multiple causes from 18 to 62%. Multiply caused cough has been the result of three diseases up to 42% of the time.     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   These are the same pts (now being labeled as having "irritable larynx syndrome" by some cough centers) who not  infrequently have a history of having failed to tolerate ace inhibitors,  dry powder inhalers (or even HFA flovent)  or biphosphonates or report having atypical/extraesophageal reflux symptoms that don't respond to standard doses of PPI  and are easily confused as having aecopd or asthma flares by even experienced allergists/ pulmonologists (myself included).   In addition, Of the three most common causes of  Sub-acute or recurrent or chronic cough, only one (GERD)  can actually contribute to/ trigger  the other two (asthma and post nasal drip syndrome)  and perpetuate the cylce of cough.  While not intuitively obvious, many patients with chronic low grade reflux do not cough until there is a primary insult that disturbs the protective epithelial barrier and exposes sensitive nerve endings.   This is typically viral but can be direct physical injury such as with an endotracheal tube.   The point is that once this occurs, it is difficult to eliminate the cycle  using anything but a maximally effective acid suppression regimen at least in the short run, accompanied by an appropriate diet to address non acid GERD and control / eliminate the cough itself for at least 3 days.   rec cyclical cough regimen with pred x 6 days and off flovent and also hold off tramadol/ narcotic cough meds for now and just use tessalon  Return in 4 weeks to regroup / look at longterm solutions to prevent recurrence  Total time devoted to counseling  > 50 % of initial 60 min office visit:  review case with pt/ discussion of options/alternatives/ personally creating written customized instructions  in presence of pt  then going over those  specific  Instructions directly with the pt including how to use all of the meds but in particular covering each new medication in detail and the difference between the maintenance= "automatic" meds and the prns using an action plan format for the latter (If this problem/symptom => do that  organization reading Left to right).  Please see AVS from this visit for a full list of these instructions which I personally wrote for this pt and  are unique to this visit.

## 2016-06-12 NOTE — Patient Instructions (Addendum)
Stop flovent  Only use your albuterol (ventolin) as a rescue medication to be used if you can't catch your breath by resting or doing a relaxed purse lip breathing pattern or can't stop coughing  - The less you use it, the better it will work when you need it. - Ok to use up to 2 puffs  every 4 hours if you must but call for immediate appointment if use goes up over your usual need - Don't leave home without it !!  (think of it like the spare tire for your car)   Pantoprazole (protonix) 40 mg   Take  30-60 min before first meal of the day and Pepcid (famotidine)  20 mg one after   Supper until return to office - this is the best way to tell whether stomach acid is contributing to your problem.  GERD (REFLUX)  is an extremely common cause of respiratory symptoms just like yours , many times with no obvious heartburn at all.    It can be treated with medication, but also with lifestyle changes including elevation of the head of your bed (ideally with 6 inch  bed blocks),  Smoking cessation, avoidance of late meals, excessive alcohol, and avoid fatty foods, chocolate, peppermint, colas, red wine, and acidic juices such as orange juice.  NO MINT OR MENTHOL PRODUCTS SO NO COUGH DROPS  USE SUGARLESS CANDY INSTEAD (Jolley ranchers or Stover's or Life Savers) or even ice chips will also do - the key is to swallow to prevent all throat clearing. NO OIL BASED VITAMINS - use powdered substitutes.      For drainage / throat tickle try take CHLORPHENIRAMINE  4 mg - take one every 4 hours as needed - available over the counter- may cause drowsiness so start with just a bedtime dose or two and see how you tolerate it before trying in daytime    Tessalon 200 mg three times daily for cough   Prednisone 10 mg take  4 each am x 2 days,   2 each am x 2 days,  1 each am x 2 days and stop  Please remember to go to the  x-ray department downstairs in the basement  for your tests - we will call you with the results  when they are available.    Please schedule a follow up office visit in 4 weeks, sooner if needed

## 2016-06-13 DIAGNOSIS — J45991 Cough variant asthma: Secondary | ICD-10-CM | POA: Insufficient documentation

## 2016-06-13 NOTE — Progress Notes (Signed)
Spoke with pt and notified of results per Dr. Wert. Pt verbalized understanding and denied any questions. 

## 2016-06-13 NOTE — Assessment & Plan Note (Addendum)
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

## 2016-07-16 ENCOUNTER — Ambulatory Visit: Payer: Medicare HMO | Admitting: Osteopathic Medicine

## 2016-07-25 DIAGNOSIS — N390 Urinary tract infection, site not specified: Secondary | ICD-10-CM | POA: Diagnosis not present

## 2016-07-31 ENCOUNTER — Encounter: Payer: Medicare HMO | Admitting: Osteopathic Medicine

## 2016-08-20 ENCOUNTER — Encounter: Payer: Medicare HMO | Admitting: Osteopathic Medicine

## 2016-08-22 ENCOUNTER — Ambulatory Visit: Payer: Medicare HMO | Admitting: Internal Medicine

## 2016-11-26 ENCOUNTER — Telehealth: Payer: Self-pay | Admitting: Osteopathic Medicine

## 2016-11-26 DIAGNOSIS — Z13220 Encounter for screening for lipoid disorders: Secondary | ICD-10-CM

## 2016-11-26 DIAGNOSIS — Z780 Asymptomatic menopausal state: Secondary | ICD-10-CM

## 2016-11-26 DIAGNOSIS — Z0000 Encounter for general adult medical examination without abnormal findings: Secondary | ICD-10-CM

## 2016-11-26 NOTE — Telephone Encounter (Signed)
Patient called scheduled medicare wellness exam 12/06/16 req to get labs sent down this week so she can go before holiday. Please adv req a call back to know labs were sent down. Thanks

## 2016-11-26 NOTE — Telephone Encounter (Signed)
What labs would you like ordered?

## 2016-11-26 NOTE — Telephone Encounter (Signed)
CBC, CMP, lipid panel, vitamin D, TSH. Diagnosis: Annual physical exam, postmenopausal, lipid screening

## 2016-11-27 NOTE — Telephone Encounter (Signed)
Labs ordered  Pt advised

## 2016-11-28 DIAGNOSIS — Z0000 Encounter for general adult medical examination without abnormal findings: Secondary | ICD-10-CM | POA: Diagnosis not present

## 2016-11-28 DIAGNOSIS — Z13220 Encounter for screening for lipoid disorders: Secondary | ICD-10-CM | POA: Diagnosis not present

## 2016-11-28 DIAGNOSIS — Z780 Asymptomatic menopausal state: Secondary | ICD-10-CM | POA: Diagnosis not present

## 2016-11-29 LAB — CBC WITH DIFFERENTIAL/PLATELET
BASOS ABS: 63 {cells}/uL (ref 0–200)
Basophils Relative: 1 %
EOS ABS: 151 {cells}/uL (ref 15–500)
EOS PCT: 2.4 %
HEMATOCRIT: 42.1 % (ref 35.0–45.0)
HEMOGLOBIN: 14.7 g/dL (ref 11.7–15.5)
LYMPHS ABS: 2174 {cells}/uL (ref 850–3900)
MCH: 31.3 pg (ref 27.0–33.0)
MCHC: 34.9 g/dL (ref 32.0–36.0)
MCV: 89.6 fL (ref 80.0–100.0)
MPV: 11.6 fL (ref 7.5–12.5)
Monocytes Relative: 8.1 %
NEUTROS ABS: 3402 {cells}/uL (ref 1500–7800)
Neutrophils Relative %: 54 %
Platelets: 241 10*3/uL (ref 140–400)
RBC: 4.7 10*6/uL (ref 3.80–5.10)
RDW: 13.1 % (ref 11.0–15.0)
Total Lymphocyte: 34.5 %
WBC mixed population: 510 cells/uL (ref 200–950)
WBC: 6.3 10*3/uL (ref 3.8–10.8)

## 2016-11-29 LAB — COMPLETE METABOLIC PANEL WITH GFR
AG Ratio: 1.8 (calc) (ref 1.0–2.5)
ALKALINE PHOSPHATASE (APISO): 64 U/L (ref 33–130)
ALT: 21 U/L (ref 6–29)
AST: 24 U/L (ref 10–35)
Albumin: 4.6 g/dL (ref 3.6–5.1)
BUN: 20 mg/dL (ref 7–25)
CALCIUM: 9.9 mg/dL (ref 8.6–10.4)
CO2: 27 mmol/L (ref 20–32)
CREATININE: 0.93 mg/dL (ref 0.50–0.99)
Chloride: 104 mmol/L (ref 98–110)
GFR, Est African American: 73 mL/min/{1.73_m2} (ref >=60)
GFR, Est Non African American: 63 mL/min/{1.73_m2} (ref >=60)
GLUCOSE: 97 mg/dL (ref 65–99)
Globulin: 2.5 g/dL (calc) (ref 1.9–3.7)
Potassium: 4.3 mmol/L (ref 3.5–5.3)
Sodium: 139 mmol/L (ref 135–146)
Total Bilirubin: 1.7 mg/dL — ABNORMAL HIGH (ref 0.2–1.2)
Total Protein: 7.1 g/dL (ref 6.1–8.1)

## 2016-11-29 LAB — LIPID PANEL
CHOL/HDL RATIO: 5.4 (calc) — AB (ref <5.0)
CHOLESTEROL: 321 mg/dL — AB (ref <200)
HDL: 60 mg/dL (ref >50)
LDL CHOLESTEROL (CALC): 214 mg/dL — AB
Non-HDL Cholesterol (Calc): 261 mg/dL (calc) — ABNORMAL HIGH (ref <130)
TRIGLYCERIDES: 261 mg/dL — AB (ref <150)

## 2016-11-29 LAB — VITAMIN D 25 HYDROXY (VIT D DEFICIENCY, FRACTURES): Vit D, 25-Hydroxy: 32 ng/mL (ref 30–100)

## 2016-11-29 LAB — TSH: TSH: 3.87 mIU/L (ref 0.40–4.50)

## 2016-12-06 ENCOUNTER — Encounter (INDEPENDENT_AMBULATORY_CARE_PROVIDER_SITE_OTHER): Payer: Self-pay

## 2016-12-06 ENCOUNTER — Ambulatory Visit (INDEPENDENT_AMBULATORY_CARE_PROVIDER_SITE_OTHER): Payer: Medicare HMO | Admitting: Osteopathic Medicine

## 2016-12-06 ENCOUNTER — Encounter: Payer: Self-pay | Admitting: Osteopathic Medicine

## 2016-12-06 VITALS — BP 137/79 | HR 68 | Temp 97.9°F | Resp 16 | Wt 141.8 lb

## 2016-12-06 DIAGNOSIS — Z1231 Encounter for screening mammogram for malignant neoplasm of breast: Secondary | ICD-10-CM | POA: Diagnosis not present

## 2016-12-06 DIAGNOSIS — J45909 Unspecified asthma, uncomplicated: Secondary | ICD-10-CM | POA: Diagnosis not present

## 2016-12-06 DIAGNOSIS — Z780 Asymptomatic menopausal state: Secondary | ICD-10-CM

## 2016-12-06 DIAGNOSIS — N390 Urinary tract infection, site not specified: Secondary | ICD-10-CM | POA: Diagnosis not present

## 2016-12-06 DIAGNOSIS — Z1239 Encounter for other screening for malignant neoplasm of breast: Secondary | ICD-10-CM

## 2016-12-06 DIAGNOSIS — E7800 Pure hypercholesterolemia, unspecified: Secondary | ICD-10-CM

## 2016-12-06 DIAGNOSIS — R05 Cough: Secondary | ICD-10-CM

## 2016-12-06 DIAGNOSIS — Z0000 Encounter for general adult medical examination without abnormal findings: Secondary | ICD-10-CM | POA: Diagnosis not present

## 2016-12-06 DIAGNOSIS — M8580 Other specified disorders of bone density and structure, unspecified site: Secondary | ICD-10-CM | POA: Diagnosis not present

## 2016-12-06 DIAGNOSIS — R058 Other specified cough: Secondary | ICD-10-CM

## 2016-12-06 MED ORDER — FLUTICASONE FUROATE 100 MCG/ACT IN AEPB: 1.0000 | INHALATION_SPRAY | Freq: Every day | RESPIRATORY_TRACT | 3 refills | Status: DC

## 2016-12-06 MED ORDER — PITAVASTATIN CALCIUM 2 MG PO TABS: 2.0000 mg | ORAL_TABLET | Freq: Every day | ORAL | 3 refills | Status: DC

## 2016-12-06 MED ORDER — ALBUTEROL SULFATE HFA 108 (90 BASE) MCG/ACT IN AERS: 1.0000 | INHALATION_SPRAY | RESPIRATORY_TRACT | 3 refills | Status: DC | PRN

## 2016-12-06 MED ORDER — ZOSTER VAC RECOMB ADJUVANTED 50 MCG/0.5ML IM SUSR: 0.5000 mL | Freq: Once | INTRAMUSCULAR | 1 refills | Status: AC

## 2016-12-06 MED ORDER — BENZONATATE 200 MG PO CAPS: 200.0000 mg | ORAL_CAPSULE | Freq: Three times a day (TID) | ORAL | 1 refills | Status: DC | PRN

## 2016-12-06 NOTE — Progress Notes (Signed)
HPI: Sheryl Freeman is a 71 y.o. female  who presents to [facility] today, 12/06/16,  for Medicare Annual Wellness Exam  Patient presents for annual physical/Medicare wellness exam.  Other medical issues addressed:  HLD: stopped statin d/t muscle aches and swelling sensation. Lipids are... High. See below. No CAD, DM2. Nonsmoker.   Cold-induced asthma - inhaler - using the albuterol few times per day in the fall/winter. Notes cough/sob/wheeze helped by the rescue inhaler   UTI - having multiple UTIs earlier this year but no problems past several months.       Past medical, surgical, social and family history reviewed:  Patient Active Problem List   Diagnosis Date Noted  . Cough variant asthma 06/13/2016  . Upper airway cough syndrome 06/12/2016  . 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 Shortness of breath 01/24/2016  . Postmenopausal 01/24/2016  . Gastroesophageal reflux disease 01/24/2016  . Chronic midline low back pain without sciatica 09/05/2015  . Acne rosacea 08/30/2011  . 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 Hypercholesteremia 08/30/2011  . 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 Osteopenia 08/30/2011  . 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 Vitamin D deficiency 08/30/2011  . Clostridium difficile colitis 07/24/2011    Past Surgical History:  Procedure Laterality Date  . BREAST LUMPECTOMY Right     Social History   Socioeconomic History  . Marital status: Married    Spouse name: Not on file  . Number of children: Not on file  . Years of education: Not on file  . Highest education level: Not on file  Social Needs  . Financial resource strain: Not on file  . Food insecurity - worry: Not on file  . Food insecurity - inability: Not on file  . Transportation needs - medical: Not on file  . Transportation needs - non-medical: Not on file  Occupational History  . Not on  file  Tobacco Use  . Smoking status: Never Smoker  . Smokeless tobacco: Never Used  Substance and Sexual Activity  . Alcohol use: No  . Drug use: No  . Sexual activity: Not on file  Other Topics Concern  . Not on file  Social History Narrative  . Not on file    Family History  Problem Relation Age of Onset  . Diabetes Father   . Liver disease Father   . Alzheimer's disease Mother      Current medication list and allergy/intolerance information reviewed:    Outpatient Encounter Medications as of 12/06/2016  Medication Sig Note  . albuterol (PROVENTIL HFA;VENTOLIN HFA) 108 (90 Base) MCG/ACT inhaler Inhale into the lungs every 6 (six) hours as needed for wheezing or shortness of breath.   . benzonatate (TESSALON) 200 MG capsule Take 1 capsule (200 mg total) by mouth 3 (three) times daily as needed for cough.   . famotidine (PEPCID) 20 MG tablet Take 20 mg by mouth daily.   . pantoprazole (PROTONIX) 40 MG tablet Take 1 tablet (40 mg total) by mouth daily. Take 30-60 min before first meal of the day   . simvastatin (ZOCOR) 40 MG tablet Take by mouth. 01/24/2016: Received from: [place]: Take one tablet (40 mg total) by mouth every evening.  Marland Kitchen Zoster Vaccine Adjuvanted [facility]) injection Inject 0.5 mLs into the muscle once for 1 dose. Repeat injection 2-6 months   . [DISCONTINUED] predniSONE (DELTASONE) 10 MG tablet Take  4 each am x 2 days,  2 each am x 2 days,  1 each am x 2 days and stop    No facility-administered encounter medications on file as of 12/06/2016.     Allergies  Allergen Reactions  . Codeine Itching       Review of Systems: Review of Systems - Negative except as per HPI   Medicare Wellness Questionnaire  Are there smokers in your home (other than you)? no  Depression Screen (Note: if answer to either of the following is "Yes", a more complete depression screening is indicated)   Q1: Over the past two weeks, have you felt down,  depressed or hopeless? no  Q2: Over the past two weeks, have you felt little interest or pleasure in doing things? no  Have you lost interest or pleasure in daily life? no  Do you often feel hopeless? no  Do you cry easily over simple problems? no  Activities of Daily Living In your present state of health, do you have any difficulty performing the following activities?:  Driving? no Managing money?  no Feeding yourself? no Getting from bed to chair? no Climbing a flight of stairs? no Preparing food and eating?: no Bathing or showering? no Getting dressed: no Getting to the toilet? no Using the toilet: no Moving around from place to place: no In the past year have you fallen or had a near fall?: no  Hearing Difficulties:  Do you often ask people to speak up or repeat themselves? no Do you experience ringing or noises in your ears? no  Do you have difficulty understanding soft or whispered voices? no  Memory Difficulties:  Do you feel that you have a problem with memory? no  Do you often misplace items? no  Do you feel safe at home?  yes  Sexual Health:   Are you sexually active?  Yes  Do you have more than one partner?  No  Advanced Directives:   Advanced directives discussed: has an advanced directive - a copy HAS NOT been provided.  Additional information provided: yes  Risk Factors  Current exercise habits: regular walking  Dietary issues discussed:no concerns  Cardiac risk factors: hypercholesterolemia/hyperlipidemia   Exam:  BP 137/79 (BP Location: Left Arm, Patient Position: Sitting, Cuff Size: Small)   Pulse 68   Temp 97.9 F (36.6 C) (Oral)   Resp 16   Wt 141 lb 12.8 oz (64.3 kg)   SpO2 96%   BMI 23.60 kg/m  Vision by Snellen chart: right eye:see nurse notes, left eye:see nurse notes  Constitutional: VS see above. General Appearance: alert, well-developed, well-nourished, NAD  Ears, Nose, Mouth, Throat: MMM  Neck: No masses, trachea midline.    Respiratory: Normal respiratory effort. no wheeze, no rhonchi, no rales  Cardiovascular:No lower extremity edema.   Musculoskeletal: Gait normal. No clubbing/cyanosis of digits.   Neurological: Normal balance/coordination. No tremor. Recalls 3 objects and able to read face of watch with correct time.   Skin: warm, dry, intact. No rash/ulcer.   Psychiatric: Normal judgment/insight. Normal mood and affect. Oriented x3.   Recent Results (from the past 2160 hour(s))  CBC with Differential     Status: None   Collection Time: 11/28/16  8:14 AM  Result Value Ref Range   WBC 6.3 3.8 - 10.8 Thousand/uL   RBC 4.70 3.80 - 5.10 Million/uL   Hemoglobin 14.7 11.7 - 15.5 g/dL   HCT 42.1 35.0 - 45.0 %   MCV 89.6 80.0 - 100.0 fL   MCH 31.3 27.0 -  33.0 pg   MCHC 34.9 32.0 - 36.0 g/dL   RDW 13.1 11.0 - 15.0 %   Platelets 241 140 - 400 Thousand/uL   MPV 11.6 7.5 - 12.5 fL   Neutro Abs 3,402 1,500 - 7,800 cells/uL   Lymphs Abs 2,174 850 - 3,900 cells/uL   WBC mixed population 510 200 - 950 cells/uL   Eosinophils Absolute 151 15 - 500 cells/uL   Basophils Absolute 63 0 - 200 cells/uL   Neutrophils Relative % 54 %   Total Lymphocyte 34.5 %   Monocytes Relative 8.1 %   Eosinophils Relative 2.4 %   Basophils Relative 1.0 %  COMPLETE METABOLIC PANEL WITH GFR     Status: Abnormal   Collection Time: 11/28/16  8:14 AM  Result Value Ref Range   Glucose, Bld 97 65 - 99 mg/dL    Comment: .            Fasting reference interval .    BUN 20 7 - 25 mg/dL   Creat 0.93 0.50 - 0.99 mg/dL    Comment: For patients >34 years of age, the reference limit for Creatinine is approximately 13% higher for people identified as African-American. .    GFR, Est Non African American 63 > OR = 60 mL/min/1.61m2   GFR, Est African American 73 > OR = 60 mL/min/1.66m2   BUN/Creatinine Ratio NOT APPLICABLE 6 - 22 (calc)   Sodium 139 135 - 146 mmol/L   Potassium 4.3 3.5 - 5.3 mmol/L   Chloride 104 98 - 110 mmol/L    CO2 27 20 - 32 mmol/L   Calcium 9.9 8.6 - 10.4 mg/dL   Total Protein 7.1 6.1 - 8.1 g/dL   Albumin 4.6 3.6 - 5.1 g/dL   Globulin 2.5 1.9 - 3.7 g/dL (calc)   AG Ratio 1.8 1.0 - 2.5 (calc)   Total Bilirubin 1.7 (H) 0.2 - 1.2 mg/dL   Alkaline phosphatase (APISO) 64 33 - 130 U/L   AST 24 10 - 35 U/L   ALT 21 6 - 29 U/L  Lipid Profile     Status: Abnormal   Collection Time: 11/28/16  8:14 AM  Result Value Ref Range   Cholesterol 321 (H) <200 mg/dL   HDL 60 >50 mg/dL   Triglycerides 261 (H) <150 mg/dL   LDL Cholesterol (Calc) 214 (H) mg/dL (calc)    Comment:    Total CHOL/HDL Ratio 5.4 (H) <5.0 (calc)   Non-HDL Cholesterol (Calc) 261 (H) <130 mg/dL (calc)    Comment:   Vitamin D (25 hydroxy)     Status: None   Collection Time: 11/28/16  8:14 AM  Result Value Ref Range   Vit D, 25-Hydroxy 32 30 - 100 ng/mL    Comment:   TSH     Status: None   Collection Time: 11/28/16  8:14 AM  Result Value Ref Range   TSH 3.87 0.40 - 4.50 mIU/L     ASSESSMENT/PLAN:   Encounter for Medicare annual wellness exam  Medicare annual wellness visit, subsequent - Plan: Zoster Vaccine Adjuvanted [facility]) injection  Postmenopausal  Osteopenia, unspecified location - not yet due for repeat Dexa  Hypercholesteremia - statin intolerance mild, trial Livalo and consider PCSK9-I - Plan: Pitavastatin Calcium (LIVALO) 2 MG TABS, Lipid panel, COMPLETE METABOLIC PANEL WITH GFR  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, unspecified asthma severity, unspecified whether persistent - trial ICS in winter months rather than resort to only frequent rescue inhaler use   Breast cancer screening -  Plan: MM DIGITAL SCREENING BILATERAL  Upper airway cough syndrome - Plan: Fluticasone Furoate (ARNUITY ELLIPTA) 100 MCG/ACT AEPB, albuterol (PROVENTIL HFA;VENTOLIN HFA) 108 (90 Base) MCG/ACT inhaler, benzonatate (TESSALON) 200 MG capsule  Frequent UTI - consider ppx if recurrence, has been some time since any  issue  CANCER SCREENING  Lung - USPSTF: 55-80yo w/ 30 py hx unless quit w/in 41yr - does not need  Colon - does not need  Breast - needs  Cervical - does not need OTHER DISEASE SCREENING  Lipid - does not need  DM2 - does not need  AAA - female 4-75yo ever smoked - does not need  Osteoporosis - women 70yo+, men 70yo+ - does not need INFECTIOUS DISEASE SCREENING  HIV - does not need  GC/CT - does not need  HepC -does not need  TB - does not need ADULT VACCINATION  Influenza - annual vaccine recommended  Td - booster every 10 years - does not need  Zoster - option at 50, yes at 34+ - needs  PCV13 - does not need - fairly certain she had this already but we don;t have records   PPSV23 - does not need Immunization History  Administered Date(s) Administered  . Influenza, Seasonal, Injecte, Preservative Fre 11/11/2012, 11/11/2013  . Influenza,trivalent, recombinat, inj, PF 11/03/2011  . Influenza-Unspecified 11/03/2014, 11/09/2015, 10/29/2016  . Pneumococcal Polysaccharide-23 08/30/2011  . Tdap 07/17/2010  . Zoster 09/16/2007   OTHER  Fall - exercise and Vit D age 32+ - needs  Advanced Directives -  Discussed as above   During the course of the visit the patient was educated and counseled about appropriate screening and preventive services as noted above.   Patient Instructions (the written plan) was given to the patient.  Medicare Attestation I have personally reviewed: The patient's medical and social history Their use of alcohol, tobacco or illicit drugs Their current medications and supplements The patient's functional ability including ADLs,fall risks, home safety risks, cognitive, and hearing and visual impairment Diet and physical activities Evidence for depression or mood disorders  The patient's weight, height, BMI, and visual acuity have been recorded in the chart.  I have made referrals, counseling, and provided education to the patient based  on review of the above and I have provided the patient with a written personalized care plan for preventive services.     Emeterio Reeve, DO   12/06/16   Visit summary with medication list and pertinent instructions was printed for patient to review. All questions at time of visit were answered - patient instructed to contact office with any additional concerns. ER/RTC precautions were reviewed with the patient. Follow-up plan: Return in about 1 year (around 12/06/2017) for annual check up, and recheck labs in 1-3 months after starting new cholesterol medicine .

## 2016-12-10 ENCOUNTER — Telehealth: Payer: Self-pay | Admitting: *Deleted

## 2016-12-10 NOTE — Telephone Encounter (Signed)
Pre Authorization sent to cover my meds. [id number]

## 2016-12-11 NOTE — Telephone Encounter (Signed)
livalo was denied. The preferred alternatives are atorvastatin,rosuvastatin,lovastatin,ezetimibe. It was noted on prior form sent to the insurance company that the patient has a statin intolerance and she has tried and failed simvastatin

## 2016-12-21 ENCOUNTER — Telehealth: Payer: Self-pay

## 2016-12-21 ENCOUNTER — Other Ambulatory Visit: Payer: Self-pay | Admitting: Osteopathic Medicine

## 2016-12-21 MED ORDER — PRAVASTATIN SODIUM 10 MG PO TABS: 10.0000 mg | ORAL_TABLET | Freq: Every day | ORAL | 1 refills | Status: DC

## 2016-12-21 NOTE — Telephone Encounter (Signed)
Patient advised of new cholesterol medication pravastatin, verbalized understanding. No further inquiries asked during phone call.

## 2016-12-21 NOTE — Telephone Encounter (Signed)
Left a brief voicemail for patient to return call back to office regarding new cholesterol medication.

## 2016-12-21 NOTE — Telephone Encounter (Signed)
Patient called earlier today requesting an update on cholesterol medication. Patient was informed that PA was denied for livalo. Preferred alternatives are atorvastatin, rosuvastatin, lovustatin and ezetimibe. Patient is opened to any suggestion from provider. Please advise, thanks!

## 2016-12-21 NOTE — Telephone Encounter (Signed)
Pravastatin is a milder statin we could try if she's ok with that. Or Red Yeast Rice OTC supplement plus fish oil

## 2016-12-21 NOTE — Telephone Encounter (Signed)
ERROR

## 2016-12-21 NOTE — Telephone Encounter (Signed)
As per patient, has tried Red yeast Rice OTC and it did not work. She is currently taking fish oil daily. Patient is open to trying the Pravastatin medication.

## 2016-12-21 NOTE — Telephone Encounter (Signed)
Sent lowest dose pravastatin to [facility] for 30 day supply, please ask patient to call us if any problems asap, or call us in 4 weeks to let us know how she is doing on the medication

## 2016-12-31 ENCOUNTER — Emergency Department
Admission: EM | Admit: 2016-12-31 | Discharge: 2016-12-31 | Disposition: A | Discharge status: Home / Self Care | Payer: Medicare HMO | Source: Home / Self Care | Attending: Family Medicine | Admitting: Family Medicine

## 2016-12-31 ENCOUNTER — Encounter: Payer: Self-pay | Admitting: Emergency Medicine

## 2016-12-31 DIAGNOSIS — R300 Dysuria: Secondary | ICD-10-CM | POA: Diagnosis not present

## 2016-12-31 DIAGNOSIS — M545 Low back pain, unspecified: Secondary | ICD-10-CM

## 2016-12-31 DIAGNOSIS — Z87440 Personal history of urinary (tract) infections: Secondary | ICD-10-CM

## 2016-12-31 LAB — POCT URINALYSIS DIP (MANUAL ENTRY)
Bilirubin, UA: NEGATIVE
Blood, UA: NEGATIVE
Glucose, UA: NEGATIVE mg/dL
Ketones, POC UA: NEGATIVE mg/dL
Nitrite, UA: NEGATIVE
Protein Ur, POC: NEGATIVE mg/dL
Spec Grav, UA: 1.025 (ref 1.010–1.025)
Urobilinogen, UA: 0.2 E.U./dL
pH, UA: 6 (ref 5.0–8.0)

## 2016-12-31 MED ORDER — LEVOFLOXACIN 250 MG PO TABS: 250.0000 mg | ORAL_TABLET | Freq: Every day | ORAL | 0 refills | Status: AC

## 2016-12-31 NOTE — ED Triage Notes (Signed)
Patient complaining of dysuria, low back pain, urgency, frequency, started last night.

## 2016-12-31 NOTE — ED Provider Notes (Signed)
[facility]    CSN: [id number] Arrival date & time: 12/31/16  1102     History   Chief Complaint Chief Complaint  Patient presents with  . Urinary Tract Infection    HPI CHANCE KARAM is a 71 y.o. female.   HPI  CHIVON LEPAGE is a 71 y.o. female presenting to UC with c/o sudden onset dysuria, bladder pressure, low back pain and urinary frequency that started last night. Hx of recurrent UTIs.  Denies fever, chills, n/v/d. She notes she did take a 1/2 tab of 500mg  levaquin (that she had leftover)  last night. She knows she should not have taken it but was hopeful it would stop her symptoms.  No other medications taken PTA.   Past Medical History:  Diagnosis Date  . Asthma   . Frequent UTI   . High cholesterol     Patient Active Problem List   Diagnosis Date Noted  . Cough variant asthma 06/13/2016  . Upper airway cough syndrome 06/12/2016  . 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 Shortness of breath 01/24/2016  . Postmenopausal 01/24/2016  . Gastroesophageal reflux disease 01/24/2016  . Chronic midline low back pain without sciatica 09/05/2015  . Acne rosacea 08/30/2011  . 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 Hypercholesteremia 08/30/2011  . 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 Osteopenia 08/30/2011  . 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 Vitamin D deficiency 08/30/2011  . Clostridium difficile colitis 07/24/2011    Past Surgical History:  Procedure Laterality Date  . BREAST LUMPECTOMY Right     OB History    No data available       Home Medications    Prior to Admission medications   Medication Sig Start Date End Date Taking? Authorizing Provider  albuterol (PROVENTIL HFA;VENTOLIN HFA) 108 (90 Base) MCG/ACT inhaler Inhale 1-2 puffs into the lungs every 4 (four) hours as needed for wheezing or shortness of breath. 12/06/16   Emeterio Reeve, DO  benzonatate (TESSALON) 200 MG capsule  Take 1 capsule (200 mg total) by mouth 3 (three) times daily as needed for cough. 12/06/16   Emeterio Reeve, DO  famotidine (PEPCID) 20 MG tablet Take 20 mg by mouth daily.    [provider]  Fluticasone Furoate (ARNUITY ELLIPTA) 100 MCG/ACT AEPB Inhale 1 puff into the lungs daily. 12/06/16   Emeterio Reeve, DO  levofloxacin (LEVAQUIN) 250 MG tablet Take 1 tablet (250 mg total) by mouth daily for 3 days. 12/31/16 01/03/17  Noe Gens, PA-C  pantoprazole (PROTONIX) 40 MG tablet Take 1 tablet (40 mg total) by mouth daily. Take 30-60 min before first meal of the day 06/12/16   Tanda Rockers, MD  Pitavastatin Calcium (LIVALO) 2 MG TABS Take 1 tablet (2 mg total) by mouth daily. 12/06/16   Emeterio Reeve, DO  pravastatin (PRAVACHOL) 10 MG tablet TAKE 1 TABLET(10 MG) BY MOUTH DAILY 12/23/16   Emeterio Reeve, DO    Family History Family History  Problem Relation Age of Onset  . Diabetes Father   . Liver disease Father   . Alzheimer's disease Mother     Social History Social History   Tobacco Use  . Smoking status: Never Smoker  . Smokeless tobacco: Never Used  Substance Use Topics  . Alcohol use: No  . Drug use: No     Allergies   Simvastatin and Codeine   Review of Systems Review of Systems  Constitutional: Negative for chills and fever.  Gastrointestinal: Positive for abdominal pain (bladder pressure). Negative for diarrhea, nausea and vomiting.  Genitourinary: Positive for dysuria, frequency and urgency. Negative for decreased urine volume, flank pain and hematuria.  Musculoskeletal: Positive for back pain. Negative for myalgias.     Physical Exam Triage Vital Signs ED Triage Vitals [12/31/16 1125]  Enc Vitals Group     BP 127/76     Pulse Rate 80     Resp      Temp 98 F (36.7 C)     Temp Source Oral     SpO2 97 %     Weight 141 lb 8 oz (64.2 kg)     Height 5\' 5"  (1.651 m)     Head Circumference      Peak Flow      Pain Score 0      Pain Loc      Pain Edu?      Excl. in [place]?    No data found.  Updated Vital Signs BP 127/76 (BP Location: Right Arm)   Pulse 80   Temp 98 F (36.7 C) (Oral)   Ht 5\' 5"  (1.651 m)   Wt 141 lb 8 oz (64.2 kg)   SpO2 97%   BMI 23.55 kg/m   Visual Acuity Right Eye Distance:   Left Eye Distance:   Bilateral Distance:    Right Eye Near:   Left Eye Near:    Bilateral Near:     Physical Exam  Constitutional: She is oriented to person, place, and time. She appears well-developed and well-nourished. No distress.  HENT:  Head: Normocephalic and atraumatic.  Mouth/Throat: Oropharynx is clear and moist.  Eyes: EOM are normal.  Neck: Normal range of motion.  Cardiovascular: Normal rate and regular rhythm.  Pulmonary/Chest: Effort normal and breath sounds normal. No stridor. No respiratory distress. She has no wheezes. She has no rales.  Abdominal: Soft. She exhibits no distension. There is no tenderness. There is no CVA tenderness.  Musculoskeletal: Normal range of motion.  Neurological: She is alert and oriented to person, place, and time.  Skin: Skin is warm and dry. She is not diaphoretic.  Psychiatric: She has a normal mood and affect. Her behavior is normal.  Nursing note and vitals reviewed.    UC Treatments / Results  Labs (all labs ordered are listed, but only abnormal results are displayed) Labs Reviewed  POCT URINALYSIS DIP (MANUAL ENTRY) - Abnormal; Notable for the following components:      Result Value   Color, UA other (*)    Leukocytes, UA Trace (*)    All other components within normal limits  URINE CULTURE    EKG  EKG Interpretation None       Radiology No results found.  Procedures Procedures (including critical care time)  Medications Ordered in UC Medications - No data to display   Initial Impression / Assessment and Plan / UC Course  I have reviewed the triage vital signs and the nursing notes.  Pertinent labs & imaging results that were  available during my care of the patient were reviewed by me and considered in my medical decision making (see chart for details).     UA: questionable UTI Will send culture  Due to hx of recurrent UTIs, will start on 3 days of Levaquin 250mg . F/u with PCP later this week if not improving or for recurrent UTIs.   Final Clinical Impressions(s) / UC Diagnoses   Final diagnoses:  Dysuria  Acute bilateral low  back pain without sciatica  History of recurrent UTIs    ED Discharge Orders        Ordered    levofloxacin (LEVAQUIN) 250 MG tablet  Daily     12/31/16 1141       Controlled Substance Prescriptions [place] Controlled Substance Registry consulted? Not Applicable   Tyrell Antonio 12/31/16 1234

## 2017-01-01 LAB — URINE CULTURE
MICRO NUMBER:: 81445964
SPECIMEN QUALITY:: ADEQUATE

## 2017-01-02 ENCOUNTER — Telehealth: Payer: Self-pay | Admitting: Emergency Medicine

## 2017-01-02 NOTE — Telephone Encounter (Signed)
Spoke with patient and gave her urine culture results; she has not improved; encouraged her to follow with her pcp.

## 2017-01-04 ENCOUNTER — Telehealth: Payer: Self-pay | Admitting: Emergency Medicine

## 2017-01-04 NOTE — Telephone Encounter (Signed)
Just checking on you hope you are feeling better.

## 2017-01-11 DIAGNOSIS — E782 Mixed hyperlipidemia: Secondary | ICD-10-CM | POA: Insufficient documentation

## 2017-03-09 DIAGNOSIS — N390 Urinary tract infection, site not specified: Secondary | ICD-10-CM | POA: Diagnosis not present

## 2017-03-09 DIAGNOSIS — R300 Dysuria: Secondary | ICD-10-CM | POA: Diagnosis not present

## 2017-03-22 ENCOUNTER — Other Ambulatory Visit: Payer: Self-pay | Admitting: Internal Medicine

## 2017-03-22 ENCOUNTER — Ambulatory Visit (INDEPENDENT_AMBULATORY_CARE_PROVIDER_SITE_OTHER): Payer: Medicare HMO

## 2017-03-22 ENCOUNTER — Encounter (INDEPENDENT_AMBULATORY_CARE_PROVIDER_SITE_OTHER): Payer: Self-pay

## 2017-03-22 DIAGNOSIS — Z1231 Encounter for screening mammogram for malignant neoplasm of breast: Secondary | ICD-10-CM | POA: Diagnosis not present

## 2017-03-22 DIAGNOSIS — R058 Other specified cough: Secondary | ICD-10-CM

## 2017-03-22 DIAGNOSIS — R05 Cough: Secondary | ICD-10-CM

## 2017-04-29 ENCOUNTER — Ambulatory Visit: Payer: Medicare HMO | Admitting: Internal Medicine

## 2017-04-29 ENCOUNTER — Other Ambulatory Visit (INDEPENDENT_AMBULATORY_CARE_PROVIDER_SITE_OTHER): Payer: Medicare HMO

## 2017-04-29 ENCOUNTER — Encounter: Payer: Self-pay | Admitting: Internal Medicine

## 2017-04-29 VITALS — BP 140/80 | HR 68 | Ht 65.0 in | Wt 147.0 lb

## 2017-04-29 DIAGNOSIS — R05 Cough: Secondary | ICD-10-CM

## 2017-04-29 DIAGNOSIS — R058 Other specified cough: Secondary | ICD-10-CM

## 2017-04-29 LAB — CBC WITH DIFFERENTIAL/PLATELET
BASOS PCT: 0.9 % (ref 0.0–3.0)
Basophils Absolute: 0.1 10*3/uL (ref 0.0–0.1)
EOS PCT: 1.3 % (ref 0.0–5.0)
Eosinophils Absolute: 0.1 10*3/uL (ref 0.0–0.7)
HCT: 43.2 % (ref 36.0–46.0)
Hemoglobin: 14.7 g/dL (ref 12.0–15.0)
LYMPHS ABS: 1.9 10*3/uL (ref 0.7–4.0)
Lymphocytes Relative: 25.2 % (ref 12.0–46.0)
MCHC: 34 g/dL (ref 30.0–36.0)
MCV: 92.5 fl (ref 78.0–100.0)
Monocytes Absolute: 0.6 10*3/uL (ref 0.1–1.0)
Monocytes Relative: 8.1 % (ref 3.0–12.0)
NEUTROS PCT: 64.5 % (ref 43.0–77.0)
Neutro Abs: 4.9 10*3/uL (ref 1.4–7.7)
Platelets: 231 10*3/uL (ref 150.0–400.0)
RBC: 4.67 Mil/uL (ref 3.87–5.11)
RDW: 13.4 % (ref 11.5–15.5)
WBC: 7.7 10*3/uL (ref 4.0–10.5)

## 2017-04-29 NOTE — Patient Instructions (Addendum)
I am happy the cough has resolved to your satisfaction on arnuity but blow it out thru your nose and let me know if your cough returns or lasts more than a couple of weeks in event of a "cold"   Change Pepcid 20 mg to where you take it after bfast and after supper consistently and if not better I would rather you see a GI doctor for refills of protonix   GERD (REFLUX)  is an extremely common cause of respiratory symptoms just like yours , many times with no obvious heartburn at all.    It can be treated with medication, but also with lifestyle changes including elevation of the head of your bed (ideally with 6 inch  bed blocks),  Smoking cessation, avoidance of late meals, excessive alcohol, and avoid fatty foods, chocolate, peppermint, colas, red wine, and acidic juices such as orange juice.  NO MINT OR MENTHOL PRODUCTS SO NO COUGH DROPS   USE SUGARLESS CANDY INSTEAD (Jolley ranchers or Stover's or Life Savers) or even ice chips will also do - the key is to swallow to prevent all throat clearing. NO OIL BASED VITAMINS - use powdered substitutes.   Please remember to go to the lab department downstairs in the basement  for your tests - we will call you with the results when they are available.       If you are satisfied with your treatment plan,  let your doctor know and he/she can either refill your medications or you can return here when your prescription runs out.     If in any way you are not 100% satisfied,  please tell us.  If 100% better, tell your friends!  Pulmonary follow up is as needed

## 2017-04-29 NOTE — Progress Notes (Signed)
Subjective:     Patient ID: Sheryl Freeman, female   DOB: 1945-04-14,    MRN: [id number]    Brief patient profile:  [id number] never smoker with "whooping cough" as infant but healthy childhood in [place] and good ex tolerance and did fine until in her late 2s noted with typical colds in winter would last longer than her peers up to 6 weeks at a time assoc with sob  best rx was narcotic containing cough meds but then Oct 2017 had recurrent cough that came on like the other episodes and never let up so referred to pulmonary clinic 06/12/2016 by Dr   Sheppard Coil     History of Present Illness  06/12/2016 1st [facility] office visit/ Sheryl Freeman   Chief Complaint  Patient presents with  . Pulmonary Consult    Referred by Dr. Emeterio Reeve. [name] for the past 8 months. She states she is SOB when the weather is damp and cold and also when she gets stressed. She also c/o occ non prod cough. She is using an albuterol inhaler 1x daily on average.   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                            With trouble breathing? Rarely never   Do you have choking episodes when you cannot  Get enough air, gasping for air ?              Yes def    Do you usually cough when you lie down into  The bed, or when you just lie down to rest ?                          Sometimes not every noct    Do you usually cough after meals or eating?         Yes   Do you cough when (or after) you bend over?    sometimes   GERD SCORE     Kouffman Reflux v Neurogenic Cough Differentiator Neurogenic    Do you more-or-less cough all day long? yes   Does change of temperature make you cough? Winter yes   Does laughing or chuckling cause you to cough? no   Do fumes (perfume, automobile fumes, burned  Toast, etc.,) cause you to cough ?      no   Does speaking, singing, or talking on the phone cause you to cough   ?               Sometimes    Neurogenic/Airway score    Unless coughing really  Not limited by breathing from desired activities   rec Stop flovent Only use your albuterol (ventolin) as a rescue medication  Pantoprazole (protonix) 40 mg   Take  30-60 min before first meal of the day and Pepcid (famotidine)  20 mg one after   Supper until return to  office - GERD diet   It can be treated with medication  For drainage / throat tickle try take CHLORPHENIRAMINE  4 mg - take one every 4 hours as needed - available over the counter- may cause drowsiness so start with just a bedtime dose or two and see how you tolerate it before trying in daytime   Tessalon 200 mg three times daily for cough  Prednisone 10 mg take  4 each am x 2 days,   2 each am x 2 days,  1 each am x 2 days and stop     04/29/2017  f/u ov/Dannetta Lekas re:  astma vs uacs resolved on arnuity  Chief Complaint  Patient presents with  . Follow-up    Needing refill of pantoprazole.    Dyspnea:  Not limited by breathing from desired activities   Cough: not now Sleep: fine now  SABA use:  When cold and damp seems to need it    Prednisone = 100% better in every way and stayed better p started  arnuity While on protonix the hb resolved then pepcid before bfast before supper has some hb but no cough and would like refill.   No obvious day to day or daytime variability or assoc excess/ purulent sputum or mucus plugs or hemoptysis or cp or chest tightness, subjective wheeze or overt sinus   symptoms. No unusual exposure hx or h/o childhood pna/ asthma or knowledge of premature birth.  Sleeping flat  without nocturnal  or early am  exacerbation  of respiratory  c/o's or need for noct saba. Also denies any obvious fluctuation of symptoms with weather or environmental changes or other aggravating or alleviating factors except as outlined above   Current Allergies, Complete Past Medical History, Past Surgical History, Family History, and Social History were reviewed in [employer] record.  ROS  The following are not active complaints unless bolded Hoarseness, sore throat, dysphagia, dental problems, itching, sneezing,  nasal congestion or discharge of excess mucus or purulent secretions, ear ache,   fever, chills, sweats, unintended wt loss or wt gain, classically pleuritic or exertional cp,  orthopnea pnd or arm/hand swelling  or leg swelling, presyncope, palpitations, abdominal pain, anorexia, nausea, vomiting, diarrhea  or change in bowel habits or change in bladder habits, change in stools or change in urine, dysuria, hematuria,  rash, arthralgias, visual complaints, headache, numbness, weakness or ataxia or problems with walking or coordination,  change in mood or  memory.        Current Meds  Medication Sig  . albuterol (PROVENTIL HFA;VENTOLIN HFA) 108 (90 Base) MCG/ACT inhaler Inhale 1-2 puffs into the lungs every 4 (four) hours as needed for wheezing or shortness of breath.  . famotidine (PEPCID) 20 MG tablet Take 20 mg by mouth twice daily   . Fluticasone Furoate (ARNUITY ELLIPTA) 100 MCG/ACT AEPB Inhale 1 puff into the lungs daily.  .     . pravastatin (PRAVACHOL) 10 MG tablet TAKE 1 TABLET(10 MG) BY MOUTH DAILY                      Objective:   Physical Exam  [name]    04/29/2017      147   06/12/16 147 lb (66.7 kg)  05/19/16 150 lb (68 kg)  04/25/16 155 lb (70.3 kg)    Vital signs reviewed - Note on arrival 02 sats  97% on RA      HEENT: nl  dentition, turbinates bilaterally, and oropharynx. Nl external ear canals without cough reflex   NECK :  without  JVD/Nodes/TM/ nl carotid upstrokes bilaterally   LUNGS: no acc muscle use,  Nl contour chest which is clear to A and P bilaterally without cough on insp or exp maneuvers   CV:  RRR  no s3 or murmur or increase in P2, and no edema   ABD:  soft and nontender with nl inspiratory excursion in the supine position. No bruits or organomegaly appreciated, bowel sounds nl  MS:  Nl gait/ ext warm without deformities, calf tenderness, cyanosis or clubbing No obvious joint restrictions   SKIN: warm and dry without lesions    NEURO:  alert, approp, nl sensorium with  no motor or cerebellar deficits apparent.       Labs ordered 04/29/2017   Allergy profile   Assessment:

## 2017-04-30 ENCOUNTER — Encounter: Payer: Self-pay | Admitting: Internal Medicine

## 2017-04-30 LAB — RESPIRATORY ALLERGY PROFILE REGION II ~~LOC~~
Allergen, Cottonwood, t14: <0.1 kU/L
Allergen, D pternoyssinus,d7: <0.1 kU/L
Allergen, Mulberry, t76: <0.1 kU/L
Aspergillus fumigatus, m3: <0.1 kU/L
Bermuda Grass: <0.1 kU/L
Box Elder IgE: <0.1 kU/L
CLASS: 0
CLASS: 0
CLASS: 0
CLASS: 0
CLASS: 0
CLASS: 0
CLASS: 0
CLASS: 0
CLASS: 0
CLASS: 0
CLASS: 0
Class: 0
Class: 0
Class: 0
Class: 0
Class: 0
Class: 0
Class: 0
Class: 0
Class: 0
Class: 0
Class: 0
Class: 0
Class: 0
Dog Dander: <0.1 kU/L
Elm IgE: <0.1 kU/L
IgE (Immunoglobulin E), Serum: 11 kU/L (ref <=114)
Johnson Grass: <0.1 kU/L
Sheep Sorrel IgE: <0.1 kU/L

## 2017-04-30 LAB — INTERPRETATION:

## 2017-04-30 NOTE — Progress Notes (Signed)
Left detailed msg with results ok per DPR 

## 2017-04-30 NOTE — Assessment & Plan Note (Addendum)
FENO 06/12/2016  =   Unable to perform  Arnuity rx 12/06/16 >  Cough resolved at f/u pulmonary ov 04/29/2017  - Allergy profile 04/29/2017 >  Eos 0.1 /  IgE pending    I had an extended final summary discussion with the patient reviewing all relevant studies completed to date and  lasting 15 to 20 minutes of a 25 minute visit on the following issues:     Reports cough responded 100% to prednisone and then relapsed but then responded to arnuity which "really helps her allergies" but note she continued to report need for saba "whenever it's cold and damp"  Therefore as in many cases of chronic cough, there may be many factors / triggers involved and certainly uacs and cough variant asthma are not mutually exclusive dx's and may benefit from allergy w/u (pending)  For now doing better x for HB on just arnuity s active cough so rec  Change h2 to pc bid and obseve diet   If continues to have HB as the main symptom I would prefer referral to gi rather than continue to refill it thru this office based on:   the recent press about ppi's in the context of a statistically significant (but questionably clinically relevant) increase in CRI in pts on ppi vs h2's and all the other assoc reports re adverse side effects from long term use> bottom line is the lowest dose of ppi that controls   gerd is the right dose and if that dose is zero that's fine esp since h2's are cheaper.   Pulmonary f/u can be prn

## 2017-05-16 DIAGNOSIS — S81801A Unspecified open wound, right lower leg, initial encounter: Secondary | ICD-10-CM | POA: Diagnosis not present

## 2017-05-16 DIAGNOSIS — L03115 Cellulitis of right lower limb: Secondary | ICD-10-CM | POA: Diagnosis not present

## 2017-05-29 DIAGNOSIS — J029 Acute pharyngitis, unspecified: Secondary | ICD-10-CM | POA: Diagnosis not present

## 2017-07-12 ENCOUNTER — Other Ambulatory Visit: Payer: Self-pay | Admitting: Osteopathic Medicine

## 2017-07-19 ENCOUNTER — Telehealth: Payer: Self-pay | Admitting: Osteopathic Medicine

## 2017-07-19 NOTE — Telephone Encounter (Signed)
Left VM with update.  

## 2017-07-19 NOTE — Telephone Encounter (Signed)
I usually advise patients they can call us the week prior to her visit and I will be sure to have lab orders in place for her to get blood drawn at least 2 days prior to her appointment (that way if any lab changes in the meantime, won't affect orders put in today, etc, since ordering this far in advance tends to cause problems)

## 2017-07-19 NOTE — Telephone Encounter (Signed)
Patient called and has picked up Pravastatin at the pharmacy and wants to know if she needs to complete labs before her appointment in December to see if the medication is working. Last labs in chart was from 11/2016. Please advise.

## 2017-11-25 DIAGNOSIS — M722 Plantar fascial fibromatosis: Secondary | ICD-10-CM | POA: Diagnosis not present

## 2017-11-25 DIAGNOSIS — M7752 Other enthesopathy of left foot: Secondary | ICD-10-CM | POA: Diagnosis not present

## 2017-12-09 ENCOUNTER — Ambulatory Visit: Payer: Medicare HMO

## 2017-12-09 ENCOUNTER — Encounter: Payer: Medicare HMO | Admitting: Osteopathic Medicine

## 2017-12-10 ENCOUNTER — Ambulatory Visit: Payer: Medicare HMO | Admitting: Osteopathic Medicine

## 2018-01-09 DIAGNOSIS — H524 Presbyopia: Secondary | ICD-10-CM | POA: Diagnosis not present

## 2018-01-13 ENCOUNTER — Ambulatory Visit (INDEPENDENT_AMBULATORY_CARE_PROVIDER_SITE_OTHER): Payer: Medicare HMO | Admitting: Osteopathic Medicine

## 2018-01-13 ENCOUNTER — Encounter: Payer: Self-pay | Admitting: Osteopathic Medicine

## 2018-01-13 VITALS — BP 144/85 | HR 64 | Temp 98.0°F | Wt 151.0 lb

## 2018-01-13 DIAGNOSIS — M47896 Other spondylosis, lumbar region: Secondary | ICD-10-CM

## 2018-01-13 DIAGNOSIS — M8580 Other specified disorders of bone density and structure, unspecified site: Secondary | ICD-10-CM | POA: Diagnosis not present

## 2018-01-13 DIAGNOSIS — J45991 Cough variant asthma: Secondary | ICD-10-CM

## 2018-01-13 DIAGNOSIS — Z23 Encounter for immunization: Secondary | ICD-10-CM

## 2018-01-13 DIAGNOSIS — Z0000 Encounter for general adult medical examination without abnormal findings: Secondary | ICD-10-CM | POA: Diagnosis not present

## 2018-01-13 DIAGNOSIS — Z1239 Encounter for other screening for malignant neoplasm of breast: Secondary | ICD-10-CM

## 2018-01-13 DIAGNOSIS — E559 Vitamin D deficiency, unspecified: Secondary | ICD-10-CM | POA: Diagnosis not present

## 2018-01-13 DIAGNOSIS — D369 Benign neoplasm, unspecified site: Secondary | ICD-10-CM

## 2018-01-13 DIAGNOSIS — M47892 Other spondylosis, cervical region: Secondary | ICD-10-CM

## 2018-01-13 DIAGNOSIS — K219 Gastro-esophageal reflux disease without esophagitis: Secondary | ICD-10-CM | POA: Diagnosis not present

## 2018-01-13 DIAGNOSIS — R7301 Impaired fasting glucose: Secondary | ICD-10-CM | POA: Diagnosis not present

## 2018-01-13 DIAGNOSIS — E7800 Pure hypercholesterolemia, unspecified: Secondary | ICD-10-CM

## 2018-01-13 MED ORDER — ZOSTER VAC RECOMB ADJUVANTED 50 MCG/0.5ML IM SUSR: 0.5000 mL | Freq: Once | INTRAMUSCULAR | 1 refills | Status: AC

## 2018-01-13 NOTE — Patient Instructions (Addendum)
General Preventive Care  Most recent routine screening lipids/other labs: ordered today.  Everyone should have blood pressure checked once per year. Yours is a little above goal. Plan to return for nurse visit to verify home blood pressure cuff. In the meantime, be keeping a record of you rblood pressures at home and bring this with you to the visit with the nurse. If your cuff is measuring within 5-10 points of ours AND your home numbers are less than 140/90 (ideally less than 130/80) then nothing else to do. If your home blood pressure cuff is inaccurate or is accurate but measuring above goal, we will need to talk about medications.   Tobacco: don't!   Alcohol: responsible moderation is ok for most adults.!   Exercise: as tolerated to reduce risk of cardiovascular disease and diabetes. Strength training will also prevent osteoporosis.   Mental health: if need for mental health care (medicines, counseling, other), or concerns about moods, please let me know!   Sexual health: if need for STD testing, or if concerns with libido/pain problems, please let me know!  Advanced Directive: Living Will and/or Healthcare Power of Attorney recommended for all adults, regardless of age or health.  Vaccines  Flu vaccine: recommended for almost everyone, every fall.   Shingles vaccine: get the second shot! Please have your pharmacy fax [place] the records of your immunization to [phone].   Pneumonia vaccine: already done.   Tetanus booster: done 07/2010. Tdap recommended every 10 years.  Cancer screenings   Colon cancer screening: Follow 09/2019 as directed by Dr. Aniceto Boss office [facility] [place], Phone: [phone]   Breast cancer screening: mammogram recommended annually age 56-75. Ordered today.   Cervical cancer screening: Pap every 1 to 5 years depending on age and other risk factors. Can usually stop at age 68 or w/ hysterectomy, as long as previously normal testing.   Lung cancer  screening: not needed for non smokers.  Infection screenings . HIV, Gonorrhea/Chlamydia: screening as needed . Hepatitis C: recommended for anyone born [birth year]-1965, ordered w/ labs . TB: certain at-risk populations, or depending on work requirements and/or travel history Other . Bone Density Test: recommended for women at age 65. Ordered today.

## 2018-01-13 NOTE — Progress Notes (Signed)
HPI: Sheryl Freeman is a 73 y.o. female who  has a past medical history of Asthma, Frequent UTI, and High cholesterol.  she presents to [facility] today, 01/13/18,  for chief complaint of: Annual Physical     Patient here for annual physical / wellness exam.  See preventive care reviewed as below.  Recent labs reviewed in detail with the patient.   Additional concerns today include:  None    BP Readings from Last 3 Encounters:  01/13/18 (!) 144/85  04/29/17 140/80  12/31/16 127/76       Past medical, surgical, social and family history reviewed:  Patient Active Problem List   Diagnosis Date Noted  . Elevated cholesterol with elevated triglycerides 01/11/2017  . Cough variant asthma 06/13/2016  . Upper airway cough syndrome  vs cough variant asthma 06/12/2016  . 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 Shortness of breath 01/24/2016  . Postmenopausal 01/24/2016  . Gastroesophageal reflux disease 01/24/2016  . Chronic midline low back pain without sciatica 09/05/2015  . Acne rosacea 08/30/2011  . 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 Hypercholesteremia 08/30/2011  . 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 Osteopenia 08/30/2011  . 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 Vitamin D deficiency 08/30/2011  . Clostridium difficile colitis 07/24/2011    Past Surgical History:  Procedure Laterality Date  . BREAST LUMPECTOMY Right     Social History   Tobacco Use  . Smoking status: Never Smoker  . Smokeless tobacco: Never Used  Substance Use Topics  . Alcohol use: No    Family History  Problem Relation Age of Onset  . Diabetes Father   . Liver disease Father   . Alzheimer's disease Mother      Current medication list and allergy/intolerance information reviewed:    Current Outpatient Medications  Medication Sig Dispense Refill  . albuterol (PROVENTIL HFA;VENTOLIN HFA) 108 (90  Base) MCG/ACT inhaler Inhale 1-2 puffs into the lungs every 4 (four) hours as needed for wheezing or shortness of breath. 1 Inhaler 3  . famotidine (PEPCID) 20 MG tablet Take 20 mg by mouth daily.    . Fluticasone Furoate (ARNUITY ELLIPTA) 100 MCG/ACT AEPB Inhale 1 puff into the lungs daily. 30 each 3  . pantoprazole (PROTONIX) 40 MG tablet Take 1 tablet (40 mg total) by mouth daily. Take 30-60 min before first meal of the day 30 tablet 2  . pravastatin (PRAVACHOL) 10 MG tablet TAKE 1 TABLET(10 MG) BY MOUTH DAILY 90 tablet 0   No current facility-administered medications for this visit.     Allergies  Allergen Reactions  . Simvastatin Swelling and Other (See Comments)    Muscle pain - hands  . Codeine Itching      Review of Systems:  Constitutional:  No  fever, no chills, No recent illness, No unintentional weight changes. No significant fatigue.   HEENT: No  headache, no vision change, no hearing change, No sore throat, No  sinus pressure  Cardiac: No  chest pain, No  pressure, No palpitations, No  Orthopnea  Respiratory:  No  shortness of breath. No  Cough  Gastrointestinal: No  abdominal pain, No  nausea, No  vomiting,  No  blood in stool, No  diarrhea, No  constipation   Musculoskeletal: No new myalgia/arthralgia  Skin: No  Rash, No other wounds/concerning lesions  Genitourinary: No  incontinence, No  abnormal genital bleeding, No abnormal genital discharge  Hem/Onc: No  easy bruising/bleeding  Endocrine: No cold intolerance,  No heat intolerance. No polyuria/polydipsia/polyphagia   Neurologic: No  weakness, No  dizziness, No  slurred speech/focal weakness/facial droop  Psychiatric: No  concerns with depression, No  concerns with anxiety  Exam:  BP (!) 144/85 (BP Location: Left Arm, Patient Position: Sitting, Cuff Size: Normal)   Pulse 64   Temp 98 F (36.7 C) (Oral)   Wt 151 lb (68.5 kg)   BMI 25.13 kg/m   Constitutional: VS see above. General Appearance:  alert, well-developed, well-nourished, NAD  Eyes: Normal lids and conjunctive, non-icteric sclera  Ears, Nose, Mouth, Throat: MMM, Normal external inspection ears/nares/mouth/lips/gums. TM normal bilaterally. Pharynx/tonsils no erythema, no exudate. Nasal mucosa normal.   Neck: No masses, trachea midline. No thyroid enlargement. No tenderness/mass appreciated. No lymphadenopathy  Respiratory: Normal respiratory effort. no wheeze, no rhonchi, no rales  Cardiovascular: S1/S2 normal, no murmur, no rub/gallop auscultated. RRR. No lower extremity edema.  Gastrointestinal: Nontender, no masses. No hepatomegaly, no splenomegaly. No hernia appreciated. Bowel sounds normal. Rectal exam deferred.   Musculoskeletal: Gait normal. No clubbing/cyanosis of digits.   Neurological: Normal balance/coordination. No tremor. No cranial nerve deficit on limited exam. Motor and sensation intact and symmetric. Cerebellar reflexes intact.   Skin: warm, dry, intact. No rash/ulcer. No concerning nevi or subq nodules on limited exam.    Psychiatric: Normal judgment/insight. Normal mood and affect. Oriented x3.      Immunization History  Administered Date(s) Administered  . Influenza, High Dose Seasonal PF 10/29/2016, 10/08/2017  . Influenza, Seasonal, Injecte, Preservative Fre 11/11/2012, 11/11/2013  . Influenza,trivalent, recombinat, inj, PF 11/03/2011  . Influenza-Unspecified 11/03/2014, 11/09/2015, 10/29/2016  . Pneumococcal Polysaccharide-23 08/30/2011  . Tdap 07/17/2010  . Zoster 09/16/2007      ASSESSMENT/PLAN: The primary encounter diagnosis was Annual physical exam. Diagnoses of Osteopenia, unspecified location, Other osteoarthritis of spine, lumbar region, Other osteoarthritis of spine, cervical region, Cough variant asthma, Gastroesophageal reflux disease, esophagitis presence not specified, Hypercholesteremia, Tubulovillous adenoma, Need for shingles vaccine, and Breast cancer screening were  also pertinent to this visit.   Orders Placed This Encounter  Procedures  . DG Bone Density  . MM 3D SCREEN BREAST BILATERAL  . CBC  . COMPLETE METABOLIC PANEL WITH GFR  . Lipid panel  . VITAMIN D 25 Hydroxy (Vit-D Deficiency, Fractures)    Meds ordered this encounter  Medications  . Zoster Vaccine Adjuvanted [facility]) injection    Sig: Inject 0.5 mLs into the muscle once for 1 dose. Repeat in 2 to 6 months    Dispense:  0.5 mL    Refill:  1    Patient Instructions  General Preventive Care  Most recent routine screening lipids/other labs: ordered today.  Everyone should have blood pressure checked once per year. Yours is a little above goal. Plan to return for nurse visit to verify home blood pressure cuff. In the meantime, be keeping a record of you rblood pressures at home and bring this with you to the visit with the nurse. If your cuff is measuring within 5-10 points of ours AND your home numbers are less than 140/90 (ideally less than 130/80) then nothing else to do. If your home blood pressure cuff is inaccurate or is accurate but measuring above goal, we will need to talk about medications.   Tobacco: don't!   Alcohol: responsible moderation is ok for most adults.!   Exercise: as tolerated to reduce risk of cardiovascular disease and diabetes. Strength training will also prevent osteoporosis.   Mental health:  if need for mental health care (medicines, counseling, other), or concerns about moods, please let me know!   Sexual health: if need for STD testing, or if concerns with libido/pain problems, please let me know!  Advanced Directive: Living Will and/or Healthcare Power of Attorney recommended for all adults, regardless of age or health.  Vaccines  Flu vaccine: recommended for almost everyone, every fall.   Shingles vaccine: get the second shot! Please have your pharmacy fax [place] the records of your immunization to [phone].   Pneumonia vaccine: already done.    Tetanus booster: done 07/2010. Tdap recommended every 10 years.  Cancer screenings   Colon cancer screening: Follow 09/2019 as directed by Dr. Aniceto Boss office [facility] [place], Phone: [phone]   Breast cancer screening: mammogram recommended annually age 39-75. Ordered today.   Cervical cancer screening: Pap every 1 to 5 years depending on age and other risk factors. Can usually stop at age 60 or w/ hysterectomy, as long as previously normal testing.   Lung cancer screening: not needed for non smokers.  Infection screenings . HIV, Gonorrhea/Chlamydia: screening as needed . Hepatitis C: recommended for anyone born 102-1964, ordered w/ labs . TB: certain at-risk populations, or depending on work requirements and/or travel history Other . Bone Density Test: recommended for women at age 72. Ordered today.         Visit summary with medication list and pertinent instructions was printed for patient to review. All questions at time of visit were answered - patient instructed to contact office with any additional concerns or updates. ER/RTC precautions were reviewed with the patient.    Please note: voice recognition software was used to produce this document, and typos may escape review. Please contact Dr. Sheppard Coil for any needed clarifications.     Follow-up plan: Return for nurse visit verify home BP monitor. Otherwise annual check w/ Dr. Loni Muse  1 yr, medicare visit if needed.

## 2018-01-15 LAB — COMPLETE METABOLIC PANEL WITH GFR
AG Ratio: 1.8 (calc) (ref 1.0–2.5)
ALBUMIN MSPROF: 4.5 g/dL (ref 3.6–5.1)
ALKALINE PHOSPHATASE (APISO): 67 U/L (ref 33–130)
ALT: 43 U/L — ABNORMAL HIGH (ref 6–29)
AST: 40 U/L — AB (ref 10–35)
BILIRUBIN TOTAL: 1.5 mg/dL — AB (ref 0.2–1.2)
BUN: 16 mg/dL (ref 7–25)
CHLORIDE: 106 mmol/L (ref 98–110)
CO2: 27 mmol/L (ref 20–32)
Calcium: 9.8 mg/dL (ref 8.6–10.4)
Creat: 0.8 mg/dL (ref 0.60–0.93)
GFR, Est African American: 86 mL/min/{1.73_m2} (ref >=60)
GFR, Est Non African American: 74 mL/min/{1.73_m2} (ref >=60)
GLUCOSE: 105 mg/dL — AB (ref 65–99)
Globulin: 2.5 g/dL (calc) (ref 1.9–3.7)
Potassium: 4.8 mmol/L (ref 3.5–5.3)
SODIUM: 141 mmol/L (ref 135–146)
Total Protein: 7 g/dL (ref 6.1–8.1)

## 2018-01-15 LAB — TEST AUTHORIZATION

## 2018-01-15 LAB — CBC
HCT: 42.9 % (ref 35.0–45.0)
Hemoglobin: 14.7 g/dL (ref 11.7–15.5)
MCH: 31.5 pg (ref 27.0–33.0)
MCHC: 34.3 g/dL (ref 32.0–36.0)
MCV: 92.1 fL (ref 80.0–100.0)
MPV: 11.4 fL (ref 7.5–12.5)
PLATELETS: 232 10*3/uL (ref 140–400)
RBC: 4.66 10*6/uL (ref 3.80–5.10)
RDW: 12.7 % (ref 11.0–15.0)
WBC: 5.8 10*3/uL (ref 3.8–10.8)

## 2018-01-15 LAB — LIPID PANEL
CHOLESTEROL: 279 mg/dL — AB (ref <200)
HDL: 58 mg/dL (ref >50)
LDL CHOLESTEROL (CALC): 161 mg/dL — AB
Non-HDL Cholesterol (Calc): 221 mg/dL (calc) — ABNORMAL HIGH (ref <130)
Total CHOL/HDL Ratio: 4.8 (calc) (ref <5.0)
Triglycerides: 356 mg/dL — ABNORMAL HIGH (ref <150)

## 2018-01-15 LAB — HEMOGLOBIN A1C W/OUT EAG: HEMOGLOBIN A1C: 5.8 %{Hb} — AB (ref <5.7)

## 2018-01-15 LAB — VITAMIN D 25 HYDROXY (VIT D DEFICIENCY, FRACTURES): Vit D, 25-Hydroxy: 28 ng/mL — ABNORMAL LOW (ref 30–100)

## 2018-01-15 NOTE — Addendum Note (Signed)
Addended by: Maryla Morrow on: 01/15/2018 05:08 PM   Modules accepted: Orders

## 2018-01-17 ENCOUNTER — Other Ambulatory Visit: Payer: Self-pay

## 2018-01-17 MED ORDER — PRAVASTATIN SODIUM 10 MG PO TABS: ORAL_TABLET | ORAL | 3 refills | Status: DC

## 2018-01-27 ENCOUNTER — Ambulatory Visit (INDEPENDENT_AMBULATORY_CARE_PROVIDER_SITE_OTHER): Payer: Medicare HMO | Admitting: Osteopathic Medicine

## 2018-01-27 VITALS — BP 141/75 | HR 67 | Ht 65.0 in | Wt 146.0 lb

## 2018-01-27 DIAGNOSIS — I10 Essential (primary) hypertension: Secondary | ICD-10-CM | POA: Diagnosis not present

## 2018-01-27 NOTE — Progress Notes (Signed)
   Subjective:    Patient ID: Sheryl Freeman, female    DOB: 04/10/1945, 73 y.o.   MRN: [id number]  HPI Patient presents to office to check her home BP machine. Pt brings with her her recordings of readings over the last 2 weeks which include 264B-583E systolic over 94M diastolic. Pt notes she feels her blood pressure is always elevated at the doctor's office due to having to take her mother to the doctor frequently and it often being stressful.     Review of Systems     Objective:   Physical Exam        Assessment & Plan:  Pt is not currently on any blood pressure medications. Initial reading on office machine was elevated at 157/81.   After about 10 minutes of rest, BP came down to 141/75 pulse 67 on office machine and 154/89 pulse 66 on home machine.   *Pt also mentioned that some ointment was discussed at her last office visit and she thought an RX was going to be called in for her to have on hand. States she thinks it was Mupirocin, but cannot recall exactly.

## 2018-01-27 NOTE — Progress Notes (Signed)
Will send the mupirocin  Her BP monitor is not measuring within 10 points of our equipment, so I'd say hers is inaccurate and we shouldn't rely on her home numbers.   Borderline BP, would strongly consider low-dose medication if patient is ok with me sending something in for her - let me know!   Return in about 4 weeks (around 02/24/2018) for BP follow-up w/ Dr A if decide to start medicines.    BP Readings from Last 3 Encounters:  01/27/18 (!) 141/75  01/13/18 (!) 144/85  04/29/17 140/80

## 2018-01-27 NOTE — Progress Notes (Signed)
Left pt detailed msg on cell of recommendations. Direct call back info provided for pt to let me know if she is ok with starting medication

## 2018-01-28 NOTE — Progress Notes (Signed)
Left pt another msg inquiring about starting medication. Direct call back info provided

## 2018-02-06 NOTE — Progress Notes (Signed)
Reached pt on home phone. She is OK with starting low dose medication to pharmacy on file. Pt has been scheduled for follow up with provider on 03/10/18.

## 2018-02-07 ENCOUNTER — Other Ambulatory Visit: Payer: Self-pay | Admitting: Osteopathic Medicine

## 2018-02-07 MED ORDER — HYDROCHLOROTHIAZIDE 12.5 MG PO TABS: 12.5000 mg | ORAL_TABLET | Freq: Every day | ORAL | 1 refills | Status: DC

## 2018-02-07 MED ORDER — MUPIROCIN 2 % EX OINT: TOPICAL_OINTMENT | CUTANEOUS | 3 refills | Status: DC

## 2018-02-07 NOTE — Telephone Encounter (Signed)
Called pharmacy and confirmed that they have received RX today from [place]. Pharmacy will reach out to patient when refill is ready.

## 2018-02-07 NOTE — Addendum Note (Signed)
Addended by: Maryla Morrow on: 02/07/2018 03:38 PM   Modules accepted: Orders

## 2018-02-07 NOTE — Progress Notes (Signed)
Sent this and the mupirocin

## 2018-03-10 ENCOUNTER — Ambulatory Visit (INDEPENDENT_AMBULATORY_CARE_PROVIDER_SITE_OTHER): Payer: Medicare HMO | Admitting: Osteopathic Medicine

## 2018-03-10 ENCOUNTER — Encounter: Payer: Self-pay | Admitting: Osteopathic Medicine

## 2018-03-10 VITALS — BP 132/70 | HR 72 | Temp 98.3°F | Wt 142.7 lb

## 2018-03-10 DIAGNOSIS — Z79899 Other long term (current) drug therapy: Secondary | ICD-10-CM

## 2018-03-10 DIAGNOSIS — I10 Essential (primary) hypertension: Secondary | ICD-10-CM

## 2018-03-10 DIAGNOSIS — R748 Abnormal levels of other serum enzymes: Secondary | ICD-10-CM

## 2018-03-10 DIAGNOSIS — R7309 Other abnormal glucose: Secondary | ICD-10-CM | POA: Diagnosis not present

## 2018-03-10 DIAGNOSIS — E7800 Pure hypercholesterolemia, unspecified: Secondary | ICD-10-CM

## 2018-03-10 MED ORDER — HYDROCHLOROTHIAZIDE 12.5 MG PO TABS: 12.5000 mg | ORAL_TABLET | Freq: Every day | ORAL | 1 refills | Status: DC

## 2018-03-10 MED ORDER — PRAVASTATIN SODIUM 20 MG PO TABS: 20.0000 mg | ORAL_TABLET | Freq: Every day | ORAL | 1 refills | Status: DC

## 2018-03-10 NOTE — Progress Notes (Signed)
HPI: Sheryl Freeman is a 73 y.o. female who  has a past medical history of Asthma, Frequent UTI, and High cholesterol.  she presents to [facility] today, 03/10/18,  for chief complaint of:  Follow up blood pressure, discuss lab results   Has been tolerating new medication HCTZ and home BP is looking better typically in 578'I systolic. Reviewed recnet labs in detail   BP Readings from Last 3 Encounters:  03/10/18 132/70  01/27/18 (!) 141/75  01/13/18 (!) 144/85      At today's visit 03/10/18 ... PMH, PSH, FH reviewed and updated as needed.  Current medication list and allergy/intolerance hx reviewed and updated as needed. (See remainder of HPI, ROS, Phys Exam below)          ASSESSMENT/PLAN: The primary encounter diagnosis was Essential hypertension. Diagnoses of Medication management, Hypercholesteremia, Elevated hemoglobin A1c measurement, and Elevated liver enzymes were also pertinent to this visit.    Meds ordered this encounter  Medications  . hydrochlorothiazide (HYDRODIURIL) 12.5 MG tablet    Sig: Take 1 tablet (12.5 mg total) by mouth daily.    Dispense:  90 tablet    Refill:  1  . pravastatin (PRAVACHOL) 20 MG tablet    Sig: Take 1 tablet (20 mg total) by mouth daily.    Dispense:  90 tablet    Refill:  1    Cancel 10 mg dose thanks        Follow-up plan: Return in about 6 months (around 09/10/2018) for monitor blood pressure, see me sooner if needed!  Keep appt 04/14/18, labs before visit .                                                 ################################################# ################################################# ################################################# #################################################    Current Meds  Medication Sig  . albuterol (PROVENTIL HFA;VENTOLIN HFA) 108 (90 Base) MCG/ACT inhaler Inhale 1-2 puffs into the  lungs every 4 (four) hours as needed for wheezing or shortness of breath.  . Cetirizine HCl 10 MG CAPS Take by mouth.  . Cetirizine HCl 10 MG TBDP Take by mouth.  . conjugated estrogens (PREMARIN) vaginal cream Place 1 Applicatorful vaginally daily.  . famotidine (PEPCID) 20 MG tablet Take 20 mg by mouth daily.  . hydrochlorothiazide (HYDRODIURIL) 12.5 MG tablet Take 1 tablet (12.5 mg total) by mouth daily.  . mupirocin ointment (BACTROBAN) 2 % Apply to affected area TID for 7 days.  . pravastatin (PRAVACHOL) 20 MG tablet Take 1 tablet (20 mg total) by mouth daily.  . [DISCONTINUED] hydrochlorothiazide (HYDRODIURIL) 12.5 MG tablet Take 1 tablet (12.5 mg total) by mouth daily.  . [DISCONTINUED] pravastatin (PRAVACHOL) 10 MG tablet TAKE 1 TABLET(10 MG) BY MOUTH DAILY    Allergies  Allergen Reactions  . Simvastatin Swelling and Other (See Comments)    Muscle pain - hands  . Codeine Itching       Review of Systems:  Constitutional: No recent illness  HEENT: No  headache, no vision change  Cardiac: No  chest pain, No  pressure, No palpitations  Respiratory:  No  shortness of breath. No  Cough  Gastrointestinal: No  abdominal pain  Musculoskeletal: No new myalgia/arthralgia  Skin: No  Rash  Neurologic: No  weakness, No  Dizziness   Exam:  BP 132/70 (BP Location: Left Arm, Patient Position:  Sitting, Cuff Size: Normal)   Pulse 72   Temp 98.3 F (36.8 C) (Oral)   Wt 142 lb 11.2 oz (64.7 kg)   BMI 23.75 kg/m   Constitutional: VS see above. General Appearance: alert, well-developed, well-nourished, NAD  Eyes: Normal lids and conjunctive, non-icteric sclera  Ears, Nose, Mouth, Throat: MMM, Normal external inspection ears/nares/mouth/lips/gums.  Neck: No masses, trachea midline.   Respiratory: Normal respiratory effort.   Musculoskeletal: Gait normal. Symmetric and independent movement of all extremities  Neurological: Normal balance/coordination. No tremor.  Skin:  warm, dry, intact.   Psychiatric: Normal judgment/insight. Normal mood and affect. Oriented x3.       Visit summary with medication list and pertinent instructions was printed for patient to review, patient was advised to alert [place] if any updates are needed. All questions at time of visit were answered - patient instructed to contact office with any additional concerns. ER/RTC precautions were reviewed with the patient and understanding verbalized.   Note: Total time spent 25 minutes, greater than 50% of the visit was spent face-to-face counseling and coordinating care for the following: The primary encounter diagnosis was Essential hypertension. Diagnoses of Medication management, Hypercholesteremia, Elevated hemoglobin A1c measurement, and Elevated liver enzymes were also pertinent to this visit.Marland Kitchen  Please note: voice recognition software was used to produce this document, and typos may escape review. Please contact Dr. Sheppard Coil for any needed clarifications.    Follow up plan: Return in about 6 months (around 09/10/2018) for monitor blood pressure, see me sooner if needed!  Keep appt 04/14/18, labs before visit .

## 2018-04-02 ENCOUNTER — Ambulatory Visit: Payer: Medicare HMO

## 2018-04-02 ENCOUNTER — Other Ambulatory Visit: Payer: Medicare HMO

## 2018-04-08 ENCOUNTER — Other Ambulatory Visit: Payer: Self-pay | Admitting: Osteopathic Medicine

## 2018-04-08 DIAGNOSIS — R058 Other specified cough: Secondary | ICD-10-CM

## 2018-04-08 DIAGNOSIS — R05 Cough: Secondary | ICD-10-CM

## 2018-04-14 ENCOUNTER — Ambulatory Visit: Payer: Medicare HMO | Admitting: Osteopathic Medicine

## 2018-05-28 ENCOUNTER — Ambulatory Visit (INDEPENDENT_AMBULATORY_CARE_PROVIDER_SITE_OTHER): Payer: Medicare HMO

## 2018-05-28 ENCOUNTER — Other Ambulatory Visit: Payer: Self-pay

## 2018-05-28 ENCOUNTER — Ambulatory Visit: Payer: Medicare HMO

## 2018-05-28 DIAGNOSIS — Z0000 Encounter for general adult medical examination without abnormal findings: Secondary | ICD-10-CM | POA: Diagnosis not present

## 2018-05-28 DIAGNOSIS — I10 Essential (primary) hypertension: Secondary | ICD-10-CM | POA: Diagnosis not present

## 2018-05-28 DIAGNOSIS — Z1231 Encounter for screening mammogram for malignant neoplasm of breast: Secondary | ICD-10-CM | POA: Diagnosis not present

## 2018-05-28 DIAGNOSIS — Z79899 Other long term (current) drug therapy: Secondary | ICD-10-CM | POA: Diagnosis not present

## 2018-05-28 DIAGNOSIS — Z1239 Encounter for other screening for malignant neoplasm of breast: Secondary | ICD-10-CM | POA: Diagnosis not present

## 2018-05-29 LAB — BASIC METABOLIC PANEL WITH GFR
BUN/Creatinine Ratio: 24 (calc) — ABNORMAL HIGH (ref 6–22)
BUN: 24 mg/dL (ref 7–25)
CO2: 25 mmol/L (ref 20–32)
Calcium: 9.9 mg/dL (ref 8.6–10.4)
Chloride: 105 mmol/L (ref 98–110)
Creat: 1.01 mg/dL — ABNORMAL HIGH (ref 0.60–0.93)
GFR, Est African American: 65 mL/min/{1.73_m2} (ref >=60)
GFR, Est Non African American: 56 mL/min/{1.73_m2} — ABNORMAL LOW (ref >=60)
Glucose, Bld: 109 mg/dL — ABNORMAL HIGH (ref 65–99)
Potassium: 4.4 mmol/L (ref 3.5–5.3)
Sodium: 141 mmol/L (ref 135–146)

## 2018-06-11 ENCOUNTER — Ambulatory Visit (INDEPENDENT_AMBULATORY_CARE_PROVIDER_SITE_OTHER): Payer: Medicare HMO

## 2018-06-11 ENCOUNTER — Other Ambulatory Visit: Payer: Self-pay

## 2018-06-11 DIAGNOSIS — M8580 Other specified disorders of bone density and structure, unspecified site: Secondary | ICD-10-CM | POA: Diagnosis not present

## 2018-06-11 DIAGNOSIS — M85852 Other specified disorders of bone density and structure, left thigh: Secondary | ICD-10-CM | POA: Diagnosis not present

## 2018-06-11 DIAGNOSIS — Z0000 Encounter for general adult medical examination without abnormal findings: Secondary | ICD-10-CM

## 2018-07-21 ENCOUNTER — Telehealth: Payer: Self-pay

## 2018-07-21 ENCOUNTER — Other Ambulatory Visit: Payer: Self-pay | Admitting: Osteopathic Medicine

## 2018-07-21 DIAGNOSIS — R058 Other specified cough: Secondary | ICD-10-CM

## 2018-07-21 DIAGNOSIS — R05 Cough: Secondary | ICD-10-CM

## 2018-07-21 MED ORDER — E-Z SPACER DEVI: 2 refills | Status: AC

## 2018-07-21 NOTE — Telephone Encounter (Signed)
Please advise 

## 2018-07-21 NOTE — Telephone Encounter (Signed)
Sent!

## 2018-07-21 NOTE — Telephone Encounter (Signed)
Pt has been updated that request has been completed. No other inquiries during call.

## 2018-07-21 NOTE — Telephone Encounter (Signed)
Pt called requesting a new spacer for her inhaler. Pls send to [facility].

## 2018-10-24 ENCOUNTER — Other Ambulatory Visit: Payer: Self-pay | Admitting: Osteopathic Medicine

## 2018-10-24 NOTE — Telephone Encounter (Signed)
Forwarding medication refill request to the clinical pool for review. 

## 2018-11-17 ENCOUNTER — Telehealth: Payer: Self-pay | Admitting: Osteopathic Medicine

## 2018-11-17 DIAGNOSIS — R7309 Other abnormal glucose: Secondary | ICD-10-CM

## 2018-11-17 DIAGNOSIS — Z0000 Encounter for general adult medical examination without abnormal findings: Secondary | ICD-10-CM

## 2018-11-17 DIAGNOSIS — I10 Essential (primary) hypertension: Secondary | ICD-10-CM

## 2018-11-17 DIAGNOSIS — E782 Mixed hyperlipidemia: Secondary | ICD-10-CM

## 2018-11-17 DIAGNOSIS — E559 Vitamin D deficiency, unspecified: Secondary | ICD-10-CM

## 2018-11-17 DIAGNOSIS — M8580 Other specified disorders of bone density and structure, unspecified site: Secondary | ICD-10-CM

## 2018-11-17 NOTE — Telephone Encounter (Signed)
Patient called and scheduled a check up/follow up appointment for next Tuesday. 11/25/2018 and wanted a lab order put in to have blood work done this week before her appointment next week. Please Advise.

## 2018-11-18 DIAGNOSIS — M8580 Other specified disorders of bone density and structure, unspecified site: Secondary | ICD-10-CM | POA: Diagnosis not present

## 2018-11-18 DIAGNOSIS — E559 Vitamin D deficiency, unspecified: Secondary | ICD-10-CM | POA: Diagnosis not present

## 2018-11-18 DIAGNOSIS — E781 Pure hyperglyceridemia: Secondary | ICD-10-CM | POA: Diagnosis not present

## 2018-11-18 DIAGNOSIS — R7309 Other abnormal glucose: Secondary | ICD-10-CM | POA: Diagnosis not present

## 2018-11-18 DIAGNOSIS — I10 Essential (primary) hypertension: Secondary | ICD-10-CM | POA: Diagnosis not present

## 2018-11-18 DIAGNOSIS — Z0000 Encounter for general adult medical examination without abnormal findings: Secondary | ICD-10-CM | POA: Diagnosis not present

## 2018-11-18 DIAGNOSIS — E782 Mixed hyperlipidemia: Secondary | ICD-10-CM | POA: Diagnosis not present

## 2018-11-18 NOTE — Telephone Encounter (Signed)
Left a message advising of labs.  

## 2018-11-18 NOTE — Telephone Encounter (Signed)
Lab orders are in.  In the future, if patients are requesting lab orders for an upcoming visit, please route the message to triage as they can put in the lab orders for the patient.

## 2018-11-20 LAB — CBC
HCT: 43.1 % (ref 35.0–45.0)
Hemoglobin: 14.7 g/dL (ref 11.7–15.5)
MCH: 31.3 pg (ref 27.0–33.0)
MCHC: 34.1 g/dL (ref 32.0–36.0)
MCV: 91.7 fL (ref 80.0–100.0)
MPV: 11.7 fL (ref 7.5–12.5)
Platelets: 238 10*3/uL (ref 140–400)
RBC: 4.7 10*6/uL (ref 3.80–5.10)
RDW: 12.5 % (ref 11.0–15.0)
WBC: 6.2 10*3/uL (ref 3.8–10.8)

## 2018-11-20 LAB — COMPLETE METABOLIC PANEL WITH GFR
AG Ratio: 1.9 (calc) (ref 1.0–2.5)
ALT: 25 U/L (ref 6–29)
AST: 27 U/L (ref 10–35)
Albumin: 4.6 g/dL (ref 3.6–5.1)
Alkaline phosphatase (APISO): 66 U/L (ref 37–153)
BUN: 14 mg/dL (ref 7–25)
CO2: 30 mmol/L (ref 20–32)
Calcium: 10 mg/dL (ref 8.6–10.4)
Chloride: 102 mmol/L (ref 98–110)
Creat: 0.93 mg/dL (ref 0.60–0.93)
GFR, Est African American: 72 mL/min/{1.73_m2} (ref >=60)
GFR, Est Non African American: 62 mL/min/{1.73_m2} (ref >=60)
Globulin: 2.4 g/dL (calc) (ref 1.9–3.7)
Glucose, Bld: 99 mg/dL (ref 65–99)
Potassium: 4 mmol/L (ref 3.5–5.3)
Sodium: 141 mmol/L (ref 135–146)
Total Bilirubin: 1.6 mg/dL — ABNORMAL HIGH (ref 0.2–1.2)
Total Protein: 7 g/dL (ref 6.1–8.1)

## 2018-11-20 LAB — LIPID PANEL
Cholesterol: 282 mg/dL — ABNORMAL HIGH (ref <200)
HDL: 51 mg/dL (ref >=50)
Non-HDL Cholesterol (Calc): 231 mg/dL (calc) — ABNORMAL HIGH (ref <130)
Total CHOL/HDL Ratio: 5.5 (calc) — ABNORMAL HIGH (ref <5.0)
Triglycerides: 536 mg/dL — ABNORMAL HIGH (ref <150)

## 2018-11-20 LAB — HEMOGLOBIN A1C
Hgb A1c MFr Bld: 5.7 % of total Hgb — ABNORMAL HIGH (ref <5.7)
Mean Plasma Glucose: 117 (calc)
eAG (mmol/L): 6.5 (calc)

## 2018-11-20 LAB — VITAMIN D 25 HYDROXY (VIT D DEFICIENCY, FRACTURES): Vit D, 25-Hydroxy: 21 ng/mL — ABNORMAL LOW (ref 30–100)

## 2018-11-20 LAB — LDL CHOLESTEROL, DIRECT: Direct LDL: 147 mg/dL — ABNORMAL HIGH (ref <100)

## 2018-11-25 ENCOUNTER — Ambulatory Visit (INDEPENDENT_AMBULATORY_CARE_PROVIDER_SITE_OTHER): Payer: Medicare HMO | Admitting: Osteopathic Medicine

## 2018-11-25 ENCOUNTER — Other Ambulatory Visit: Payer: Self-pay

## 2018-11-25 VITALS — BP 136/87 | HR 72 | Temp 98.1°F | Wt 147.1 lb

## 2018-11-25 DIAGNOSIS — R05 Cough: Secondary | ICD-10-CM | POA: Diagnosis not present

## 2018-11-25 DIAGNOSIS — E781 Pure hyperglyceridemia: Secondary | ICD-10-CM | POA: Diagnosis not present

## 2018-11-25 DIAGNOSIS — M8580 Other specified disorders of bone density and structure, unspecified site: Secondary | ICD-10-CM | POA: Diagnosis not present

## 2018-11-25 DIAGNOSIS — I10 Essential (primary) hypertension: Secondary | ICD-10-CM | POA: Diagnosis not present

## 2018-11-25 DIAGNOSIS — R058 Other specified cough: Secondary | ICD-10-CM

## 2018-11-25 DIAGNOSIS — E559 Vitamin D deficiency, unspecified: Secondary | ICD-10-CM

## 2018-11-25 MED ORDER — MUPIROCIN 2 % EX OINT: TOPICAL_OINTMENT | CUTANEOUS | 3 refills | Status: DC

## 2018-11-25 MED ORDER — OMEGA-3-ACID ETHYL ESTERS 1 G PO CAPS: 2.0000 g | ORAL_CAPSULE | Freq: Two times a day (BID) | ORAL | 3 refills | Status: DC

## 2018-11-25 MED ORDER — PRAVASTATIN SODIUM 40 MG PO TABS: 40.0000 mg | ORAL_TABLET | Freq: Every day | ORAL | 3 refills | Status: DC

## 2018-11-25 MED ORDER — HYDROCHLOROTHIAZIDE 12.5 MG PO TABS: 12.5000 mg | ORAL_TABLET | Freq: Every day | ORAL | 1 refills | Status: DC

## 2018-11-25 MED ORDER — ALBUTEROL SULFATE HFA 108 (90 BASE) MCG/ACT IN AERS: 1.0000 | INHALATION_SPRAY | RESPIRATORY_TRACT | 99 refills | Status: DC | PRN

## 2018-11-25 NOTE — Progress Notes (Signed)
HPI: Sheryl Freeman is a 73 y.o. female who  has a past medical history of Asthma, Frequent UTI, and High cholesterol.  she presents to [facility] today, 11/25/18,  for chief complaint of: Monitor BP, labs   Overall doing well, no complaints.  Labs reviewed in detail.  Stable prediabetes.  Patient was switched from simvastatin to pravastatin and LDL is doing fine but triglycerides are definitely high, greater than 500.   Past medical, surgical, social and family history reviewed:  Patient Active Problem List   Diagnosis Date Noted  . Elevated hemoglobin A1c measurement 03/10/2018  . Essential hypertension 03/10/2018  . Elevated liver enzymes 03/10/2018  . Elevated cholesterol with elevated triglycerides 01/11/2017  . Cough variant asthma 06/13/2016  . Upper airway cough syndrome  vs cough variant asthma 06/12/2016  . 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 Shortness of breath 01/24/2016  . Postmenopausal 01/24/2016  . Gastroesophageal reflux disease 01/24/2016  . Chronic midline low back pain without sciatica 09/05/2015  . Acne rosacea 08/30/2011  . 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 Hypercholesteremia 08/30/2011  . 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 Osteopenia 08/30/2011  . 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 Vitamin D deficiency 08/30/2011  . Clostridium difficile colitis 07/24/2011    Past Surgical History:  Procedure Laterality Date  . BREAST LUMPECTOMY Right     Social History   Tobacco Use  . Smoking status: Never Smoker  . Smokeless tobacco: Never Used  Substance Use Topics  . Alcohol use: No    Family History  Problem Relation Age of Onset  . Diabetes Father   . Liver disease Father   . Alzheimer's disease Mother      Current medication list and allergy/intolerance information reviewed:    Current Outpatient Medications  Medication Sig Dispense Refill  .  albuterol (VENTOLIN HFA) 108 (90 Base) MCG/ACT inhaler Inhale 1-2 puffs into the lungs every 4 (four) hours as needed for wheezing or shortness of breath. 36 g 99  . Cetirizine HCl 10 MG CAPS Take by mouth.    . conjugated estrogens (PREMARIN) vaginal cream Place 1 Applicatorful vaginally daily.    . famotidine (PEPCID) 20 MG tablet Take 20 mg by mouth daily.    . hydrochlorothiazide (HYDRODIURIL) 12.5 MG tablet Take 1 tablet (12.5 mg total) by mouth daily. 90 tablet 1  . mupirocin ointment (BACTROBAN) 2 % Apply to affected area TID for 7 days as needed for skin infection 30 g 3  . pravastatin (PRAVACHOL) 40 MG tablet Take 1 tablet (40 mg total) by mouth daily. 90 tablet 3  . Spacer/Aero-Holding Chambers (E-Z SPACER) inhaler Use as instructed 1 each 2  . omega-3 acid ethyl esters (LOVAZA) 1 g capsule Take 2 capsules (2 g total) by mouth 2 (two) times daily. 180 capsule 3   No current facility-administered medications for this visit.     Allergies  Allergen Reactions  . Simvastatin Swelling and Other (See Comments)    Muscle pain - hands  . Codeine Itching      Review of Systems:  Constitutional:  No  fever, no chills, No recent illness  HEENT: No  headache, no vision change  Cardiac: No  chest pain, No  pressure  Respiratory:  No  shortness of breath. No  Cough  Gastrointestinal: No  abdominal pain  Neurologic: No  weakness, No  dizziness  Psychiatric: No  concerns with depression, No  concerns with anxiety  Exam:  BP 136/87 (BP Location: Right Arm, Patient Position: Sitting, Cuff Size: Normal)   Pulse 72   Temp 98.1 F (36.7 C) (Oral)   Wt 147 lb 1.9 oz (66.7 kg)   BMI 24.48 kg/m   Constitutional: VS see above. General Appearance: alert, well-developed, well-nourished, NAD  Eyes: Normal lids and conjunctive, non-icteric sclera  Neck: No masses, trachea midline.   Respiratory: Normal respiratory effort. no wheeze, no rhonchi, no rales  Cardiovascular: S1/S2  normal, no murmur, no rub/gallop auscultated. RRR. No lower extremity edema.   Musculoskeletal: Gait normal. No clubbing/cyanosis of digits.   Psychiatric: Normal judgment/insight. Normal mood and affect. Oriented x3.    No results found for this or any previous visit (from the past 72 hour(s)).  No results found.   ASSESSMENT/PLAN: The primary encounter diagnosis was Hypertriglyceridemia. Diagnoses of Upper airway cough syndrome, Essential hypertension, Vitamin D deficiency, and Osteopenia, unspecified location were also pertinent to this visit.  Pretty well up-to-date on preventive care. Medications refilled as requested. We are adding Lovaza omega-3 prescription to help with triglycerides, will plan to get labs again in 3 months. Elevated liver enzymes have resolved  Orders Placed This Encounter  Procedures  . LIPID SCREENING  . COMPLETE METABOLIC PANEL WITH GFR   The 10-year ASCVD risk score Mikey Bussing DC Jr., et al., 2013) is: 17.2%   Values used to calculate the score:     Age: 37 years     Sex: Female     Is Non-Hispanic African American: No     Diabetic: No     Tobacco smoker: No     Systolic Blood Pressure: [id number] mmHg     Is BP treated: Yes     HDL Cholesterol: 51 mg/dL     Total Cholesterol: 282 mg/dL  Meds ordered this encounter  Medications  . omega-3 acid ethyl esters (LOVAZA) 1 g capsule    Sig: Take 2 capsules (2 g total) by mouth 2 (two) times daily.    Dispense:  180 capsule    Refill:  3  . pravastatin (PRAVACHOL) 40 MG tablet    Sig: Take 1 tablet (40 mg total) by mouth daily.    Dispense:  90 tablet    Refill:  3  . albuterol (VENTOLIN HFA) 108 (90 Base) MCG/ACT inhaler    Sig: Inhale 1-2 puffs into the lungs every 4 (four) hours as needed for wheezing or shortness of breath.    Dispense:  36 g    Refill:  99  . mupirocin ointment (BACTROBAN) 2 %    Sig: Apply to affected area TID for 7 days as needed for skin infection    Dispense:  30 g    Refill:   3  . hydrochlorothiazide (HYDRODIURIL) 12.5 MG tablet    Sig: Take 1 tablet (12.5 mg total) by mouth daily.    Dispense:  90 tablet    Refill:  1    Patient Instructions  General Preventive Care  Most recent routine screening lipids/other labs: already done  Everyone should have blood pressure checked once per year.   Tobacco: don't!   Alcohol: responsible moderation is ok for most adults - if you have concerns about your alcohol intake, please talk to me!   Exercise: as tolerated to reduce risk of cardiovascular disease and diabetes. Strength training will also prevent osteoporosis.   Mental health: if need for mental health care (medicines, counseling, other), or concerns about moods, please let me know!  Sexual health: if need for STD testing, or if concerns with libido/pain problems, please let me know!   Advanced Directive: Living Will and/or Healthcare Power of Attorney recommended for all adults, regardless of age or health.  Vaccines  Flu vaccine: recommended for almost everyone, every fall.   Shingles vaccine: Shingrix all done  Pneumonia vaccines: all done  Tetanus booster: Tdap recommended every 10 years. Due 2022.  Cancer screenings   Colon cancer screening: recommended for everyone at age 43-75  Breast cancer screening: mammogram recommended age 27-75  Cervical cancer screening: Can usually stop Pap at age 29 or w/ hysterectomy.   Lung cancer screening: not needed for non-smokers  Infection screenings . HIV: recommended screening at least once age 58-65 . Gonorrhea/Chlamydia: screening as needed . Hepatitis C: recommended for anyone born 101-1964 . TB: certain at-risk populations, or depending on work requirements and/or travel history Other . Bone Density Test: recommended every other year for women starting at age 71        Visit summary with medication list and pertinent instructions was printed for patient to review. All questions at time of  visit were answered - patient instructed to contact office with any additional concerns or updates. ER/RTC precautions were reviewed with the patient.   Note: Total time spent 25 minutes, greater than 50% of the visit was spent face-to-face counseling and coordinating care for the above diagnoses listed in assessment/plan.   Please note: voice recognition software was used to produce this document, and typos may escape review. Please contact Dr. Sheppard Coil for any needed clarifications.     Follow-up plan: Return in about 3 months (around 02/25/2019) for LAB VISIT ONLY, f/u w/ Dr Sheppard Coil in 6 mos / dependins on .

## 2018-11-25 NOTE — Patient Instructions (Addendum)
General Preventive Care  Most recent routine screening lipids/other labs: already done  Everyone should have blood pressure checked once per year.   Tobacco: don't!   Alcohol: responsible moderation is ok for most adults - if you have concerns about your alcohol intake, please talk to me!   Exercise: as tolerated to reduce risk of cardiovascular disease and diabetes. Strength training will also prevent osteoporosis.   Mental health: if need for mental health care (medicines, counseling, other), or concerns about moods, please let me know!   Sexual health: if need for STD testing, or if concerns with libido/pain problems, please let me know!   Advanced Directive: Living Will and/or Healthcare Power of Attorney recommended for all adults, regardless of age or health.  Vaccines  Flu vaccine: recommended for almost everyone, every fall.   Shingles vaccine: Shingrix all done  Pneumonia vaccines: all done  Tetanus booster: Tdap recommended every 10 years. Due 2022.  Cancer screenings   Colon cancer screening: recommended for everyone at age 57-75  Breast cancer screening: mammogram recommended age 77-75  Cervical cancer screening: Can usually stop Pap at age 77 or w/ hysterectomy.   Lung cancer screening: not needed for non-smokers  Infection screenings . HIV: recommended screening at least once age 68-65 . Gonorrhea/Chlamydia: screening as needed . Hepatitis C: recommended for anyone born 49-1964 . TB: certain at-risk populations, or depending on work requirements and/or travel history Other . Bone Density Test: recommended every other year for women starting at age 67

## 2019-02-25 DIAGNOSIS — E781 Pure hyperglyceridemia: Secondary | ICD-10-CM | POA: Diagnosis not present

## 2019-02-25 LAB — COMPLETE METABOLIC PANEL WITH GFR
AG Ratio: 2.2 (calc) (ref 1.0–2.5)
ALT: 30 U/L — ABNORMAL HIGH (ref 6–29)
AST: 31 U/L (ref 10–35)
Albumin: 4.8 g/dL (ref 3.6–5.1)
Alkaline phosphatase (APISO): 61 U/L (ref 37–153)
BUN/Creatinine Ratio: 18 (calc) (ref 6–22)
BUN: 19 mg/dL (ref 7–25)
CO2: 25 mmol/L (ref 20–32)
Calcium: 10 mg/dL (ref 8.6–10.4)
Chloride: 105 mmol/L (ref 98–110)
Creat: 1.03 mg/dL — ABNORMAL HIGH (ref 0.60–0.93)
GFR, Est African American: 63 mL/min/{1.73_m2} (ref >=60)
GFR, Est Non African American: 54 mL/min/{1.73_m2} — ABNORMAL LOW (ref >=60)
Globulin: 2.2 g/dL (calc) (ref 1.9–3.7)
Glucose, Bld: 108 mg/dL — ABNORMAL HIGH (ref 65–99)
Potassium: 4.5 mmol/L (ref 3.5–5.3)
Sodium: 140 mmol/L (ref 135–146)
Total Bilirubin: 2 mg/dL — ABNORMAL HIGH (ref 0.2–1.2)
Total Protein: 7 g/dL (ref 6.1–8.1)

## 2019-02-25 LAB — LIPID PANEL
Cholesterol: 223 mg/dL — ABNORMAL HIGH (ref <200)
HDL: 55 mg/dL (ref >=50)
LDL Cholesterol (Calc): 125 mg/dL (calc) — ABNORMAL HIGH
Non-HDL Cholesterol (Calc): 168 mg/dL (calc) — ABNORMAL HIGH (ref <130)
Total CHOL/HDL Ratio: 4.1 (calc) (ref <5.0)
Triglycerides: 282 mg/dL — ABNORMAL HIGH (ref <150)

## 2019-02-27 ENCOUNTER — Other Ambulatory Visit: Payer: Self-pay | Admitting: Osteopathic Medicine

## 2019-02-27 DIAGNOSIS — I10 Essential (primary) hypertension: Secondary | ICD-10-CM

## 2019-02-27 DIAGNOSIS — E7800 Pure hypercholesterolemia, unspecified: Secondary | ICD-10-CM

## 2019-02-27 MED ORDER — PRAVASTATIN SODIUM 80 MG PO TABS: 80.0000 mg | ORAL_TABLET | Freq: Every day | ORAL | 3 refills | Status: DC

## 2019-03-05 MED ORDER — MUPIROCIN 2 % EX OINT: TOPICAL_OINTMENT | CUTANEOUS | 3 refills | Status: DC

## 2019-05-19 DIAGNOSIS — E7800 Pure hypercholesterolemia, unspecified: Secondary | ICD-10-CM | POA: Diagnosis not present

## 2019-05-19 DIAGNOSIS — I10 Essential (primary) hypertension: Secondary | ICD-10-CM | POA: Diagnosis not present

## 2019-05-19 LAB — COMPLETE METABOLIC PANEL WITH GFR
AG Ratio: 1.9 (calc) (ref 1.0–2.5)
ALT: 27 U/L (ref 6–29)
AST: 28 U/L (ref 10–35)
Albumin: 4.6 g/dL (ref 3.6–5.1)
Alkaline phosphatase (APISO): 69 U/L (ref 37–153)
BUN: 16 mg/dL (ref 7–25)
CO2: 27 mmol/L (ref 20–32)
Calcium: 10.1 mg/dL (ref 8.6–10.4)
Chloride: 105 mmol/L (ref 98–110)
Creat: 0.91 mg/dL (ref 0.60–0.93)
GFR, Est African American: 73 mL/min/{1.73_m2} (ref >=60)
GFR, Est Non African American: 63 mL/min/{1.73_m2} (ref >=60)
Globulin: 2.4 g/dL (calc) (ref 1.9–3.7)
Glucose, Bld: 108 mg/dL — ABNORMAL HIGH (ref 65–99)
Potassium: 4.4 mmol/L (ref 3.5–5.3)
Sodium: 141 mmol/L (ref 135–146)
Total Bilirubin: 1.7 mg/dL — ABNORMAL HIGH (ref 0.2–1.2)
Total Protein: 7 g/dL (ref 6.1–8.1)

## 2019-05-19 LAB — LIPID PANEL
Cholesterol: 252 mg/dL — ABNORMAL HIGH (ref <200)
HDL: 59 mg/dL (ref >=50)
Non-HDL Cholesterol (Calc): 193 mg/dL (calc) — ABNORMAL HIGH (ref <130)
Total CHOL/HDL Ratio: 4.3 (calc) (ref <5.0)
Triglycerides: 437 mg/dL — ABNORMAL HIGH (ref <150)

## 2019-05-25 ENCOUNTER — Ambulatory Visit (INDEPENDENT_AMBULATORY_CARE_PROVIDER_SITE_OTHER): Payer: Medicare HMO | Admitting: Osteopathic Medicine

## 2019-05-25 ENCOUNTER — Encounter: Payer: Self-pay | Admitting: Osteopathic Medicine

## 2019-05-25 ENCOUNTER — Ambulatory Visit: Payer: Medicare HMO | Admitting: Osteopathic Medicine

## 2019-05-25 VITALS — BP 145/82 | HR 69 | Temp 97.6°F | Wt 149.0 lb

## 2019-05-25 DIAGNOSIS — I10 Essential (primary) hypertension: Secondary | ICD-10-CM

## 2019-05-25 DIAGNOSIS — E7800 Pure hypercholesterolemia, unspecified: Secondary | ICD-10-CM | POA: Diagnosis not present

## 2019-05-25 DIAGNOSIS — E781 Pure hyperglyceridemia: Secondary | ICD-10-CM

## 2019-05-25 DIAGNOSIS — R7309 Other abnormal glucose: Secondary | ICD-10-CM | POA: Diagnosis not present

## 2019-05-25 MED ORDER — COQ10 50 MG PO CAPS: 100.0000 mg | ORAL_CAPSULE | Freq: Every day | ORAL | 1 refills | Status: AC

## 2019-05-25 MED ORDER — SIMVASTATIN 20 MG PO TABS: 20.0000 mg | ORAL_TABLET | Freq: Every day | ORAL | 0 refills | Status: DC

## 2019-05-25 NOTE — Patient Instructions (Signed)
BP ok at home STOP pravastatin START simvastatin 20 + Co-Q-10  20 mg for 1-2 weeks then if no muscle issues, can increase to 40 mg daily  Will recheck cholesterol in 6 weeks If better and doing ok on the medicine - great! If not, will go back to pravastatin and Lovaza probably will add fenofibrate

## 2019-05-25 NOTE — Progress Notes (Signed)
Sheryl Freeman is a 74 y.o. female who presents to  [place] [facility]  today, 05/25/19, seeking care for the following: . HLD - on Lovaza 2 g bid, pravastatin 80 mg daily. TG 437 on 05/19/2019, I added direct LDL but doesn't look like this was done by lab. TG was 282 and LDL 125 on 02/25/2019 . HTN - HCTZ 12.5 mg daily, was 128/74 this morning at home. White coat as usual.    BP Readings from Last 3 Encounters:  05/25/19 (!) 145/82  11/25/18 136/87  03/10/18 132/70   Wt Readings from Last 3 Encounters:  05/25/19 149 lb 0.6 oz (67.6 kg)  11/25/18 147 lb 1.9 oz (66.7 kg)  03/10/18 142 lb 11.2 oz (64.7 kg)         ASSESSMENT & PLAN with other pertinent history/findings:  The primary encounter diagnosis was Essential hypertension. Diagnoses of Hypercholesteremia, Hypertriglyceridemia, and Elevated hemoglobin A1c measurement were also pertinent to this visit.    Patient Instructions  BP ok at home STOP pravastatin START simvastatin 20 + Co-Q-10  20 mg for 1-2 weeks then if no muscle issues, can increase to 40 mg daily  Will recheck cholesterol in 6 weeks If better and doing ok on the medicine - great! If not, will go back to pravastatin and Lovaza probably will add fenofibrate      Orders Placed This Encounter  Procedures  . Lipid Panel w/reflex Direct LDL  . Hemoglobin A1c    Meds ordered this encounter  Medications  . simvastatin (ZOCOR) 20 MG tablet    Sig: Take 1 tablet (20 mg total) by mouth at bedtime.    Dispense:  90 tablet    Refill:  0  . Coenzyme Q10 (COQ10) 50 MG CAPS    Sig: Take 100 mg by mouth daily.    Dispense:  180 capsule    Refill:  1       Follow-up instructions: Return in about 6 weeks (around 07/06/2019) for LAB VISIT ONLY, follow up based on results / whenever due for annual in around 6 mos .                                         BP (!) 145/82  (BP Location: Left Arm, Patient Position: Sitting, Cuff Size: Normal)   Pulse 69   Temp 97.6 F (36.4 C) (Oral)   Wt 149 lb 0.6 oz (67.6 kg)   BMI 24.80 kg/m   Current Meds  Medication Sig  . albuterol (VENTOLIN HFA) 108 (90 Base) MCG/ACT inhaler Inhale 1-2 puffs into the lungs every 4 (four) hours as needed for wheezing or shortness of breath.  . Cetirizine HCl 10 MG CAPS Take by mouth.  . conjugated estrogens (PREMARIN) vaginal cream Place 1 Applicatorful vaginally daily.  . famotidine (PEPCID) 20 MG tablet Take 20 mg by mouth daily.  . hydrochlorothiazide (HYDRODIURIL) 12.5 MG tablet Take 1 tablet (12.5 mg total) by mouth daily.  . mupirocin ointment (BACTROBAN) 2 % Apply to affected area TID for 7 days as needed for skin infection  . omega-3 acid ethyl esters (LOVAZA) 1 g capsule Take 2 capsules (2 g total) by mouth 2 (two) times daily.  Marland Kitchen Spacer/Aero-Holding Chambers (E-Z SPACER) inhaler Use as instructed  . [DISCONTINUED] pravastatin (PRAVACHOL) 80 MG tablet Take 1 tablet (80 mg total) by  mouth daily.    No results found for this or any previous visit (from the past 72 hour(s)).  No results found.  Depression screen [facility] 2/9 11/25/2018 01/13/2018 01/24/2016  Decreased Interest 0 0 0  Down, Depressed, Hopeless 0 0 0  PHQ - 2 Score 0 0 0    GAD 7 : Generalized Anxiety Score 11/25/2018 01/13/2018  Nervous, Anxious, on Edge 0 0  Control/stop worrying 0 0  Worry too much - different things 0 0  Trouble relaxing 0 0  Restless 0 0  Easily annoyed or irritable 0 0  Afraid - awful might happen 0 0  Total GAD 7 Score 0 0  Anxiety Difficulty - Not difficult at all      All questions at time of visit were answered - patient instructed to contact office with any additional concerns or updates.  ER/RTC precautions were reviewed with the patient.  Please note: voice recognition software was used to produce this document, and typos may escape review. Please contact Dr. Sheppard Coil for  any needed clarifications.

## 2019-06-12 ENCOUNTER — Other Ambulatory Visit: Payer: Self-pay | Admitting: Osteopathic Medicine

## 2019-06-12 DIAGNOSIS — Z1231 Encounter for screening mammogram for malignant neoplasm of breast: Secondary | ICD-10-CM

## 2019-06-17 NOTE — Telephone Encounter (Signed)
I can not close encounter due to it saying:  This encounter contains procedures and/or medications that do not have an associated diagnosis.  Please associate these orders before closing this encounter

## 2019-06-18 ENCOUNTER — Other Ambulatory Visit: Payer: Self-pay

## 2019-06-18 ENCOUNTER — Ambulatory Visit (INDEPENDENT_AMBULATORY_CARE_PROVIDER_SITE_OTHER): Payer: Medicare HMO

## 2019-06-18 DIAGNOSIS — Z1231 Encounter for screening mammogram for malignant neoplasm of breast: Secondary | ICD-10-CM

## 2019-08-30 ENCOUNTER — Other Ambulatory Visit: Payer: Self-pay | Admitting: Osteopathic Medicine

## 2019-09-02 DIAGNOSIS — H25813 Combined forms of age-related cataract, bilateral: Secondary | ICD-10-CM | POA: Diagnosis not present

## 2019-09-02 DIAGNOSIS — H43813 Vitreous degeneration, bilateral: Secondary | ICD-10-CM | POA: Diagnosis not present

## 2019-09-02 DIAGNOSIS — Z135 Encounter for screening for eye and ear disorders: Secondary | ICD-10-CM | POA: Diagnosis not present

## 2019-09-02 DIAGNOSIS — Z0100 Encounter for examination of eyes and vision without abnormal findings: Secondary | ICD-10-CM | POA: Diagnosis not present

## 2019-09-02 DIAGNOSIS — H52229 Regular astigmatism, unspecified eye: Secondary | ICD-10-CM | POA: Diagnosis not present

## 2019-09-24 ENCOUNTER — Encounter: Payer: Self-pay | Admitting: Osteopathic Medicine

## 2019-09-24 DIAGNOSIS — R7309 Other abnormal glucose: Secondary | ICD-10-CM | POA: Diagnosis not present

## 2019-09-24 DIAGNOSIS — I10 Essential (primary) hypertension: Secondary | ICD-10-CM | POA: Diagnosis not present

## 2019-09-24 DIAGNOSIS — E781 Pure hyperglyceridemia: Secondary | ICD-10-CM | POA: Diagnosis not present

## 2019-09-24 DIAGNOSIS — E7800 Pure hypercholesterolemia, unspecified: Secondary | ICD-10-CM | POA: Diagnosis not present

## 2019-09-25 LAB — LIPID PANEL W/REFLEX DIRECT LDL
Cholesterol: 188 mg/dL (ref <200)
HDL: 56 mg/dL (ref >=50)
LDL Cholesterol (Calc): 104 mg/dL (calc) — ABNORMAL HIGH
Non-HDL Cholesterol (Calc): 132 mg/dL (calc) — ABNORMAL HIGH (ref <130)
Total CHOL/HDL Ratio: 3.4 (calc) (ref <5.0)
Triglycerides: 162 mg/dL — ABNORMAL HIGH (ref <150)

## 2019-09-25 LAB — HEMOGLOBIN A1C
Hgb A1c MFr Bld: 5.9 % of total Hgb — ABNORMAL HIGH (ref <5.7)
Mean Plasma Glucose: 123 (calc)
eAG (mmol/L): 6.8 (calc)

## 2019-09-29 MED ORDER — SIMVASTATIN 20 MG PO TABS: 20.0000 mg | ORAL_TABLET | Freq: Every day | ORAL | 3 refills | Status: DC

## 2019-09-29 MED ORDER — SIMVASTATIN 40 MG PO TABS: 40.0000 mg | ORAL_TABLET | Freq: Every day | ORAL | 3 refills | Status: DC

## 2019-10-15 ENCOUNTER — Other Ambulatory Visit: Payer: Self-pay | Admitting: Osteopathic Medicine

## 2019-11-17 DIAGNOSIS — K635 Polyp of colon: Secondary | ICD-10-CM | POA: Diagnosis not present

## 2019-11-17 DIAGNOSIS — Z86010 Personal history of colonic polyps: Secondary | ICD-10-CM | POA: Diagnosis not present

## 2019-11-17 DIAGNOSIS — D123 Benign neoplasm of transverse colon: Secondary | ICD-10-CM | POA: Diagnosis not present

## 2019-11-23 ENCOUNTER — Encounter: Payer: Self-pay | Admitting: Osteopathic Medicine

## 2019-11-23 ENCOUNTER — Ambulatory Visit (INDEPENDENT_AMBULATORY_CARE_PROVIDER_SITE_OTHER): Payer: Medicare HMO | Admitting: Osteopathic Medicine

## 2019-11-23 ENCOUNTER — Other Ambulatory Visit: Payer: Self-pay

## 2019-11-23 VITALS — BP 133/83 | HR 66 | Temp 98.3°F | Wt 152.0 lb

## 2019-11-23 DIAGNOSIS — Z780 Asymptomatic menopausal state: Secondary | ICD-10-CM | POA: Diagnosis not present

## 2019-11-23 DIAGNOSIS — I10 Essential (primary) hypertension: Secondary | ICD-10-CM | POA: Diagnosis not present

## 2019-11-23 DIAGNOSIS — R7309 Other abnormal glucose: Secondary | ICD-10-CM

## 2019-11-23 DIAGNOSIS — R058 Other specified cough: Secondary | ICD-10-CM | POA: Diagnosis not present

## 2019-11-23 DIAGNOSIS — E559 Vitamin D deficiency, unspecified: Secondary | ICD-10-CM

## 2019-11-23 DIAGNOSIS — Z0000 Encounter for general adult medical examination without abnormal findings: Secondary | ICD-10-CM | POA: Diagnosis not present

## 2019-11-23 MED ORDER — HYDROCHLOROTHIAZIDE 12.5 MG PO TABS: 12.5000 mg | ORAL_TABLET | Freq: Every day | ORAL | 3 refills | Status: DC

## 2019-11-23 MED ORDER — ALBUTEROL SULFATE HFA 108 (90 BASE) MCG/ACT IN AERS: 1.0000 | INHALATION_SPRAY | RESPIRATORY_TRACT | 99 refills | Status: DC | PRN

## 2019-11-23 MED ORDER — SIMVASTATIN 40 MG PO TABS: 40.0000 mg | ORAL_TABLET | Freq: Every day | ORAL | 3 refills | Status: DC

## 2019-11-23 MED ORDER — OMEGA-3-ACID ETHYL ESTERS 1 G PO CAPS: 2.0000 g | ORAL_CAPSULE | Freq: Two times a day (BID) | ORAL | 3 refills | Status: DC

## 2019-11-23 NOTE — Patient Instructions (Signed)
General Preventive Care  Most recent routine screening labs: repeat in 6 mos.   Blood pressure goal 140/90 or less.   Tobacco: don't!   Alcohol: responsible moderation is ok for most adults - if you have concerns about your alcohol intake, please talk to me!   Exercise: as tolerated to reduce risk of cardiovascular disease and diabetes. Strength training will also prevent osteoporosis.   Mental health: if need for mental health care (medicines, counseling, other), or concerns about moods, please let me know!   Sexual / Reproductive health: if need for STD testing, or if concerns with libido/pain problems, please let me know!   Advanced Directive: Living Will and/or Healthcare Power of Attorney recommended for all adults, regardless of age or health.  Vaccines  Flu vaccine: every fall.   Shingles vaccine: done  Pneumonia vaccines: done.  Tetanus booster: every 10 years - due 07/2020, please contact your pharmacy about updating this (medicare plans will not cover this in-office).   COVID vaccine: THANKS for getting your vaccine! :)  Cancer screenings   Colon cancer screening: for everyone age 44-75. Colonoscopy due 09/2024  Breast cancer screening: mammogram at age 91 every other year at least, and annually after age 62.   Cervical cancer screening: Paps stop at age 50 or w/ hysterectomy if previously normal.   Lung cancer screening: not needed for non-smokers  Infection screenings  . HIV: recommended screening at least once age 97-65, more often as needed. . Gonorrhea/Chlamydia: screening as needed . Hepatitis C: recommended once for everyone age 39-75 . TB: certain at-risk populations, or depending on work requirements and/or travel history Other . Bone Density Test: due 06/2020. Ensure adequate calcium and vitamin D intake: Calcium 1200-1300 mg per day, vitamin D [phone] units per day.

## 2019-11-23 NOTE — Progress Notes (Signed)
Sheryl Freeman is a 74 y.o. female who presents to  [place] at [facility]  today, 11/23/19, seeking care for the following:   Annual physical      ASSESSMENT & PLAN with other pertinent findings:  The primary encounter diagnosis was Annual physical exam. Diagnoses of Essential hypertension, Vitamin D deficiency, Elevated hemoglobin A1c measurement, Postmenopausal, and Upper airway cough syndrome were also pertinent to this visit.   No results found for this or any previous visit (from the past 24 hour(s)).   Patient Instructions  General Preventive Care  Most recent routine screening labs: repeat in 6 mos.   Blood pressure goal 140/90 or less.   Tobacco: don't!   Alcohol: responsible moderation is ok for most adults - if you have concerns about your alcohol intake, please talk to me!   Exercise: as tolerated to reduce risk of cardiovascular disease and diabetes. Strength training will also prevent osteoporosis.   Mental health: if need for mental health care (medicines, counseling, other), or concerns about moods, please let me know!   Sexual / Reproductive health: if need for STD testing, or if concerns with libido/pain problems, please let me know!   Advanced Directive: Living Will and/or Healthcare Power of Attorney recommended for all adults, regardless of age or health.  Vaccines  Flu vaccine: every fall.   Shingles vaccine: done  Pneumonia vaccines: done.  Tetanus booster: every 10 years - due 07/2020, please contact your pharmacy about updating this (medicare plans will not cover this in-office).   COVID vaccine: THANKS for getting your vaccine! :)  Cancer screenings   Colon cancer screening: for everyone age 62-75. Colonoscopy due 09/2024  Breast cancer screening: mammogram at age 77 every other year at least, and annually after age 59.   Cervical cancer screening: Paps stop at age 30 or w/ hysterectomy if  previously normal.   Lung cancer screening: not needed for non-smokers  Infection screenings   HIV: recommended screening at least once age 59-65, more often as needed.  Gonorrhea/Chlamydia: screening as needed  Hepatitis C: recommended once for everyone age 84-16  TB: certain at-risk populations, or depending on work requirements and/or travel history Other  Bone Density Test: due 06/2020. Ensure adequate calcium and vitamin D intake: Calcium 1200-1300 mg per day, vitamin D [phone] units per day.     No orders of the defined types were placed in this encounter.   Meds ordered this encounter  Medications   albuterol (VENTOLIN HFA) 108 (90 Base) MCG/ACT inhaler    Sig: Inhale 1-2 puffs into the lungs every 4 (four) hours as needed for wheezing or shortness of breath.    Dispense:  36 g    Refill:  99   hydrochlorothiazide (HYDRODIURIL) 12.5 MG tablet    Sig: Take 1 tablet (12.5 mg total) by mouth daily.    Dispense:  90 tablet    Refill:  3   omega-3 acid ethyl esters (LOVAZA) 1 g capsule    Sig: Take 2 capsules (2 g total) by mouth 2 (two) times daily.    Dispense:  360 capsule    Refill:  3   simvastatin (ZOCOR) 40 MG tablet    Sig: Take 1 tablet (40 mg total) by mouth daily.    Dispense:  90 tablet    Refill:  3    CANCEL 20 MG THANKS       Follow-up instructions: Return in about 6 months (around 05/22/2020)  for ROUTINE LABS, MONITOR BLOOD PRESSURE. SEE [place] SOONER IF NEEDED! Marland Kitchen LABS NEXT VISIT: CBC, CMP, Lipid/LDL, A1C                                         BP 133/83 (BP Location: Left Arm, Patient Position: Sitting, Cuff Size: Normal)    Pulse 66    Temp 98.3 F (36.8 C) (Oral)    Wt 152 lb (68.9 kg)    BMI 25.29 kg/m  Constitutional:   VSS, see nurse notes  General Appearance: alert, well-developed, well-nourished, NAD Eyes:  Normal lids and conjunctive, non-icteric sclera Neck:  No masses, trachea midline  No  thyroid enlargement/tenderness/mass appreciated Respiratory:  Normal respiratory effort  No dullness/hyper-resonance to percussion  Breath sounds normal, no wheeze/rhonchi/rales Cardiovascular:  S1/S2 normal, no murmur/rub/gallop auscultated  No lower extremity edema Gastrointestinal:  Nontender, no masses  No hepatomegaly, no splenomegaly  No hernia appreciated Musculoskeletal:   Gait normal  No clubbing/cyanosis of digits Neurological:  No cranial nerve deficit on limited exam  Motor and sensation intact and symmetric Psychiatric:  Normal judgment/insight  Normal mood and affect    Current Meds  Medication Sig   albuterol (VENTOLIN HFA) 108 (90 Base) MCG/ACT inhaler Inhale 1-2 puffs into the lungs every 4 (four) hours as needed for wheezing or shortness of breath.   Cetirizine HCl 10 MG CAPS Take by mouth.   Coenzyme Q10 (COQ10) 50 MG CAPS Take 100 mg by mouth daily.   conjugated estrogens (PREMARIN) vaginal cream Place 1 Applicatorful vaginally daily.   famotidine (PEPCID) 20 MG tablet Take 20 mg by mouth daily.   hydrochlorothiazide (HYDRODIURIL) 12.5 MG tablet Take 1 tablet (12.5 mg total) by mouth daily.   omega-3 acid ethyl esters (LOVAZA) 1 g capsule Take 2 capsules (2 g total) by mouth 2 (two) times daily.   simvastatin (ZOCOR) 40 MG tablet Take 1 tablet (40 mg total) by mouth daily.   Spacer/Aero-Holding Chambers (E-Z SPACER) inhaler Use as instructed   [DISCONTINUED] albuterol (VENTOLIN HFA) 108 (90 Base) MCG/ACT inhaler Inhale 1-2 puffs into the lungs every 4 (four) hours as needed for wheezing or shortness of breath.   [DISCONTINUED] hydrochlorothiazide (HYDRODIURIL) 12.5 MG tablet Take 1 tablet (12.5 mg total) by mouth daily.   [DISCONTINUED] mupirocin ointment (BACTROBAN) 2 % Apply to affected area TID for 7 days as needed for skin infection   [DISCONTINUED] omega-3 acid ethyl esters (LOVAZA) 1 g capsule Take 2 capsules (2 g total) by  mouth 2 (two) times daily.   [DISCONTINUED] simvastatin (ZOCOR) 40 MG tablet Take 1 tablet (40 mg total) by mouth daily.    No results found for this or any previous visit (from the past 72 hour(s)).  No results found.     All questions at time of visit were answered - patient instructed to contact office with any additional concerns or updates.  ER/RTC precautions were reviewed with the patient as applicable.   Please note: voice recognition software was used to produce this document, and typos may escape review. Please contact Dr. Sheppard Coil for any needed clarifications.

## 2020-05-18 ENCOUNTER — Other Ambulatory Visit: Payer: Self-pay | Admitting: Osteopathic Medicine

## 2020-05-18 DIAGNOSIS — Z1231 Encounter for screening mammogram for malignant neoplasm of breast: Secondary | ICD-10-CM

## 2020-05-23 ENCOUNTER — Ambulatory Visit: Payer: Medicare HMO | Admitting: Osteopathic Medicine

## 2020-05-24 ENCOUNTER — Other Ambulatory Visit: Payer: Self-pay

## 2020-05-24 ENCOUNTER — Encounter: Payer: Self-pay | Admitting: Osteopathic Medicine

## 2020-05-24 ENCOUNTER — Ambulatory Visit (INDEPENDENT_AMBULATORY_CARE_PROVIDER_SITE_OTHER): Payer: Medicare HMO | Admitting: Osteopathic Medicine

## 2020-05-24 VITALS — BP 150/70 | HR 60 | Temp 97.8°F | Wt 151.0 lb

## 2020-05-24 DIAGNOSIS — M8580 Other specified disorders of bone density and structure, unspecified site: Secondary | ICD-10-CM

## 2020-05-24 DIAGNOSIS — R7303 Prediabetes: Secondary | ICD-10-CM | POA: Insufficient documentation

## 2020-05-24 DIAGNOSIS — E7800 Pure hypercholesterolemia, unspecified: Secondary | ICD-10-CM | POA: Diagnosis not present

## 2020-05-24 DIAGNOSIS — R7309 Other abnormal glucose: Secondary | ICD-10-CM

## 2020-05-24 DIAGNOSIS — I10 Essential (primary) hypertension: Secondary | ICD-10-CM | POA: Diagnosis not present

## 2020-05-24 DIAGNOSIS — E559 Vitamin D deficiency, unspecified: Secondary | ICD-10-CM

## 2020-05-24 NOTE — Progress Notes (Signed)
Sheryl Freeman is a 75 y.o. female who presents to  [place] at [facility]  today, 05/24/20, seeking care for the following: . HLD - on Lovaza 2 g bid, pravastatin 80 mg daily. TG 437 on 05/19/2019, was 437 and LDL unable to be calculated 05/19/2019, TG was 162 and LDL was 104 on 09/24/19.  Marland Kitchen HTN - HCTZ 12.5 mg daily, has not taken Rx this AM. White coat as usual.    BP Readings from Last 3 Encounters:  05/24/20 (!) 150/70  11/23/19 133/83  05/25/19 (!) 145/82   Wt Readings from Last 3 Encounters:  05/24/20 151 lb (68.5 kg)  11/23/19 152 lb (68.9 kg)  05/25/19 149 lb 0.6 oz (67.6 kg)         ASSESSMENT & PLAN with other pertinent history/findings:  The primary encounter diagnosis was Essential hypertension. Diagnoses of Hypercholesteremia, Osteopenia, unspecified location, Vitamin D deficiency, Elevated hemoglobin A1c measurement, and Prediabetes were also pertinent to this visit.  Labs today. Pt to continue to monitor BP at home, discussed will need to adjust Rx if >140/90  There are no Patient Instructions on file for this visit.   Orders Placed This Encounter  Procedures  . CBC  . COMPLETE METABOLIC PANEL WITH GFR  . Lipid panel  . Hemoglobin A1c  . VITAMIN D 25 Hydroxy (Vit-D Deficiency, Fractures)  . TSH    No orders of the defined types were placed in this encounter.      Follow-up instructions: Return in about 6 months (around 11/24/2020) for MEDICARE WELLNESS W/ RN, LABS (FASTING), ANNUAL W/ DR AFTER THAT .                                         BP (!) 150/70 (BP Location: Left Arm, Patient Position: Sitting, Cuff Size: Normal)   Pulse 60   Temp 97.8 F (36.6 C) (Oral)   Wt 151 lb (68.5 kg)   BMI 25.13 kg/m   Current Meds  Medication Sig  . albuterol (VENTOLIN HFA) 108 (90 Base) MCG/ACT inhaler Inhale 1-2 puffs into the lungs every 4 (four) hours as needed for  wheezing or shortness of breath.  . Cetirizine HCl 10 MG CAPS Take by mouth.  . Coenzyme Q10 (COQ10) 50 MG CAPS Take 100 mg by mouth daily.  Marland Kitchen conjugated estrogens (PREMARIN) vaginal cream Place 1 Applicatorful vaginally daily.  . famotidine (PEPCID) 20 MG tablet Take 20 mg by mouth daily.  . hydrochlorothiazide (HYDRODIURIL) 12.5 MG tablet Take 1 tablet (12.5 mg total) by mouth daily.  . simvastatin (ZOCOR) 40 MG tablet Take 1 tablet (40 mg total) by mouth daily.  Marland Kitchen Spacer/Aero-Holding Chambers (E-Z SPACER) inhaler Use as instructed    No results found for this or any previous visit (from the past 72 hour(s)).  No results found.  Depression screen [facility] 2/9 11/25/2018 01/13/2018 01/24/2016  Decreased Interest 0 0 0  Down, Depressed, Hopeless 0 0 0  PHQ - 2 Score 0 0 0    GAD 7 : Generalized Anxiety Score 11/25/2018 01/13/2018  Nervous, Anxious, on Edge 0 0  Control/stop worrying 0 0  Worry too much - different things 0 0  Trouble relaxing 0 0  Restless 0 0  Easily annoyed or irritable 0 0  Afraid - awful might happen 0 0  Total GAD 7 Score 0 0  Anxiety Difficulty - Not difficult at all   Constitutional:  . VSS, see nurse notes . General Appearance: alert, well-developed, well-nourished, NAD Eyes: Marland Kitchen Normal lids and conjunctive, non-icteric sclera Neck: . No masses, trachea midline Respiratory: . Normal respiratory effort . Breath sounds normal, no wheeze/rhonchi/rales Cardiovascular: . S1/S2 normal, no murmur/rub/gallop auscultated . No carotid bruit or JVD . No lower extremity edema Musculoskeletal:  . Gait normal . No clubbing/cyanosis of digits Neurological: . No cranial nerve deficit on limited exam . Motor and sensation intact and symmetric Psychiatric: . Normal judgment/insight . Normal mood and affect    All questions at time of visit were answered - patient instructed to contact office with any additional concerns or updates.  ER/RTC precautions were  reviewed with the patient.  Please note: voice recognition software was used to produce this document, and typos may escape review. Please contact Dr. Sheppard Coil for any needed clarifications.

## 2020-05-25 LAB — CBC
HCT: 41.7 % (ref 35.0–45.0)
Hemoglobin: 14.3 g/dL (ref 11.7–15.5)
MCH: 31.9 pg (ref 27.0–33.0)
MCHC: 34.3 g/dL (ref 32.0–36.0)
MCV: 93.1 fL (ref 80.0–100.0)
MPV: 12 fL (ref 7.5–12.5)
Platelets: 195 10*3/uL (ref 140–400)
RBC: 4.48 10*6/uL (ref 3.80–5.10)
RDW: 12.5 % (ref 11.0–15.0)
WBC: 5.9 10*3/uL (ref 3.8–10.8)

## 2020-05-25 LAB — COMPLETE METABOLIC PANEL WITH GFR
AG Ratio: 2 (calc) (ref 1.0–2.5)
ALT: 22 U/L (ref 6–29)
AST: 25 U/L (ref 10–35)
Albumin: 4.4 g/dL (ref 3.6–5.1)
Alkaline phosphatase (APISO): 59 U/L (ref 37–153)
BUN: 23 mg/dL (ref 7–25)
CO2: 26 mmol/L (ref 20–32)
Calcium: 9.4 mg/dL (ref 8.6–10.4)
Chloride: 107 mmol/L (ref 98–110)
Creat: 0.84 mg/dL (ref 0.60–0.93)
GFR, Est African American: 80 mL/min/{1.73_m2} (ref >=60)
GFR, Est Non African American: 69 mL/min/{1.73_m2} (ref >=60)
Globulin: 2.2 g/dL (calc) (ref 1.9–3.7)
Glucose, Bld: 103 mg/dL — ABNORMAL HIGH (ref 65–99)
Potassium: 4.5 mmol/L (ref 3.5–5.3)
Sodium: 141 mmol/L (ref 135–146)
Total Bilirubin: 1.2 mg/dL (ref 0.2–1.2)
Total Protein: 6.6 g/dL (ref 6.1–8.1)

## 2020-05-25 LAB — VITAMIN D 25 HYDROXY (VIT D DEFICIENCY, FRACTURES): Vit D, 25-Hydroxy: 42 ng/mL (ref 30–100)

## 2020-05-25 LAB — LIPID PANEL
Cholesterol: 198 mg/dL (ref <200)
HDL: 57 mg/dL (ref >=50)
LDL Cholesterol (Calc): 100 mg/dL (calc) — ABNORMAL HIGH
Non-HDL Cholesterol (Calc): 141 mg/dL (calc) — ABNORMAL HIGH (ref <130)
Total CHOL/HDL Ratio: 3.5 (calc) (ref <5.0)
Triglycerides: 309 mg/dL — ABNORMAL HIGH (ref <150)

## 2020-05-25 LAB — HEMOGLOBIN A1C
Hgb A1c MFr Bld: 5.8 % of total Hgb — ABNORMAL HIGH (ref <5.7)
Mean Plasma Glucose: 120 mg/dL
eAG (mmol/L): 6.6 mmol/L

## 2020-05-25 LAB — TSH: TSH: 1.7 mIU/L (ref 0.40–4.50)

## 2020-06-03 ENCOUNTER — Telehealth: Payer: Self-pay | Admitting: Osteopathic Medicine

## 2020-06-03 NOTE — Progress Notes (Signed)
  Chronic Care Management   Outreach Note  06/03/2020 Name: Sheryl Freeman MRN: [id number] DOB: Dec 21, 1945  Referred by: Emeterio Reeve, DO Reason for referral : No chief complaint on file.   An unsuccessful telephone outreach was attempted today. The patient was referred to the pharmacist for assistance with care management and care coordination.   Follow Up Plan:   Lauretta Grill Upstream Scheduler

## 2020-06-22 ENCOUNTER — Ambulatory Visit: Payer: Medicare HMO

## 2020-06-23 ENCOUNTER — Telehealth: Payer: Self-pay | Admitting: Osteopathic Medicine

## 2020-06-23 NOTE — Chronic Care Management (AMB) (Signed)
  Chronic Care Management   Outreach Note  06/23/2020 Name: KAREL MOWERS MRN: [id number] DOB: [birth year]  Referred by: Emeterio Reeve, DO Reason for referral : No chief complaint on file.   A second unsuccessful telephone outreach was attempted today. The patient was referred to pharmacist for assistance with care management and care coordination.  Follow Up Plan:   Lauretta Grill Upstream Scheduler

## 2020-07-05 ENCOUNTER — Telehealth: Payer: Self-pay | Admitting: Osteopathic Medicine

## 2020-07-05 NOTE — Progress Notes (Signed)
  Chronic Care Management   Outreach Note  07/05/2020 Name: Sheryl Freeman MRN: [id number] DOB: April 27, 1945  Referred by: Emeterio Reeve, DO Reason for referral : No chief complaint on file.   Third unsuccessful telephone outreach was attempted today. The patient was referred to the pharmacist for assistance with care management and care coordination.   Follow Up Plan:   Lauretta Grill Upstream Scheduler

## 2020-09-01 DIAGNOSIS — H52223 Regular astigmatism, bilateral: Secondary | ICD-10-CM | POA: Diagnosis not present

## 2020-09-01 DIAGNOSIS — H524 Presbyopia: Secondary | ICD-10-CM | POA: Diagnosis not present

## 2020-09-01 DIAGNOSIS — H43813 Vitreous degeneration, bilateral: Secondary | ICD-10-CM | POA: Diagnosis not present

## 2020-09-01 DIAGNOSIS — H25813 Combined forms of age-related cataract, bilateral: Secondary | ICD-10-CM | POA: Diagnosis not present

## 2020-09-01 DIAGNOSIS — H04123 Dry eye syndrome of bilateral lacrimal glands: Secondary | ICD-10-CM | POA: Diagnosis not present

## 2020-09-01 DIAGNOSIS — Z135 Encounter for screening for eye and ear disorders: Secondary | ICD-10-CM | POA: Diagnosis not present

## 2020-09-13 ENCOUNTER — Telehealth: Payer: Self-pay | Admitting: Osteopathic Medicine

## 2020-09-13 NOTE — Telephone Encounter (Signed)
Pt had a full set of  labs in 05/2020.  Does she need these repeated in November?  Please advise.

## 2020-09-13 NOTE — Telephone Encounter (Signed)
Pt has her physical scheduled for 11/28 and  has scheduled  an appointment for labs on 11/21 at 8:15. Please prepare lab order for that date.  Thank you.

## 2020-09-14 ENCOUNTER — Ambulatory Visit (INDEPENDENT_AMBULATORY_CARE_PROVIDER_SITE_OTHER): Payer: Medicare HMO

## 2020-09-14 ENCOUNTER — Other Ambulatory Visit: Payer: Self-pay

## 2020-09-14 DIAGNOSIS — Z1231 Encounter for screening mammogram for malignant neoplasm of breast: Secondary | ICD-10-CM | POA: Diagnosis not present

## 2020-09-15 NOTE — Telephone Encounter (Signed)
No, she does not need additional labs at this time

## 2020-09-15 NOTE — Telephone Encounter (Signed)
Pt informed no need for labs at this time.  Charyl Bigger, CMA

## 2020-09-27 ENCOUNTER — Emergency Department
Admission: EM | Admit: 2020-09-27 | Discharge: 2020-09-27 | Disposition: A | Discharge status: Home / Self Care | Payer: Medicare HMO | Source: Home / Self Care

## 2020-09-27 ENCOUNTER — Encounter: Payer: Self-pay | Admitting: Emergency Medicine

## 2020-09-27 ENCOUNTER — Other Ambulatory Visit: Payer: Self-pay

## 2020-09-27 DIAGNOSIS — J309 Allergic rhinitis, unspecified: Secondary | ICD-10-CM

## 2020-09-27 DIAGNOSIS — J0100 Acute maxillary sinusitis, unspecified: Secondary | ICD-10-CM | POA: Diagnosis not present

## 2020-09-27 DIAGNOSIS — R059 Cough, unspecified: Secondary | ICD-10-CM

## 2020-09-27 MED ORDER — FEXOFENADINE HCL 180 MG PO TABS: 180.0000 mg | ORAL_TABLET | Freq: Every day | ORAL | 0 refills | Status: DC

## 2020-09-27 MED ORDER — METHYLPREDNISOLONE 4 MG PO TBPK: ORAL_TABLET | ORAL | 0 refills | Status: DC

## 2020-09-27 MED ORDER — CEFDINIR 300 MG PO CAPS: 300.0000 mg | ORAL_CAPSULE | Freq: Two times a day (BID) | ORAL | 0 refills | Status: AC

## 2020-09-27 NOTE — ED Provider Notes (Signed)
[facility]    CSN: [id number] Arrival date & time: 09/27/20  3614      History   Chief Complaint Chief Complaint  Patient presents with   Shortness of Breath    HPI Sheryl Freeman is a 75 y.o. female.   HPI 75 year old female presents with dry cough for 10 days, reports shortness of breath for 2 of those 10 days.  PMH significant for asthma, cough variant asthma, upper airway cough syndrome versus cough variant asthma.  Patient is vaccinated for COVID-19.  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 43/15/4008   Shortness of breath 01/24/2016   Postmenopausal 01/24/2016   Gastroesophageal reflux disease 01/24/2016   Chronic midline low back pain without sciatica 09/05/2015   Acne rosacea 08/30/2011   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      Home Medications    Prior to Admission medications   Medication Sig Start Date End Date Taking? Authorizing Provider  cefdinir (OMNICEF) 300 MG capsule Take 1 capsule (300 mg total) by mouth 2 (two) times daily for 7 days. 09/27/20 10/04/20 Yes Eliezer Lofts, FNP  fexofenadine [facility] ALLERGY) 180 MG  tablet Take 1 tablet (180 mg total) by mouth daily for 15 days. 09/27/20 10/12/20 Yes Eliezer Lofts, FNP  methylPREDNISolone (MEDROL DOSEPAK) 4 MG TBPK tablet Take as directed 09/27/20  Yes Eliezer Lofts, FNP  albuterol (VENTOLIN HFA) 108 (90 Base) MCG/ACT inhaler Inhale 1-2 puffs into the lungs every 4 (four) hours as needed for wheezing or shortness of breath. 11/23/19   Emeterio Reeve, DO  Cetirizine HCl 10 MG CAPS Take by mouth.    [provider]  Coenzyme Q10 (COQ10) 50 MG CAPS Take 100 mg by mouth daily. 05/25/19   Emeterio Reeve, DO  conjugated estrogens (PREMARIN) vaginal cream Place 1 Applicatorful vaginally daily.    [provider]  famotidine (PEPCID) 20 MG tablet Take 20 mg by mouth daily.    [provider]  hydrochlorothiazide (HYDRODIURIL) 12.5 MG tablet Take 1 tablet (12.5 mg total) by mouth daily. 11/23/19   Emeterio Reeve, DO  omega-3 acid ethyl esters (LOVAZA) 1 g capsule Take 2 capsules (2 g total) by mouth 2 (two) times daily. 11/23/19 02/21/20  Emeterio Reeve, DO  simvastatin (ZOCOR) 40 MG tablet Take 1 tablet (40 mg total) by mouth daily. 11/23/19   Emeterio Reeve, DO  Spacer/Aero-Holding Chambers (E-Z SPACER) inhaler Use as instructed 07/21/18   Emeterio Reeve, DO    Family History Family History  Problem Relation Age of Onset   Diabetes Father    Liver disease Father    Alzheimer's disease Mother     Social History Social History  Tobacco Use   Smoking status: Never   Smokeless tobacco: Never  Vaping Use   Vaping Use: Never used  Substance Use Topics   Alcohol use: No   Drug use: No     Allergies   Simvastatin and Codeine   Review of Systems Review of Systems  HENT:  Positive for congestion and postnasal drip.   Respiratory:  Positive for cough and shortness of breath.   All other systems reviewed and are negative.   Physical Exam Triage Vital Signs ED Triage Vitals  Enc Vitals Group     BP  09/27/20 1008 134/87     Pulse Rate 09/27/20 1008 (!) 103     Resp 09/27/20 1008 18     Temp 09/27/20 1008 98.8 F (37.1 C)     Temp Source 09/27/20 1008 Oral     SpO2 09/27/20 1008 97 %     Weight 09/27/20 1009 142 lb (64.4 kg)     Height 09/27/20 1009 5\' 5"  (1.651 m)     Head Circumference --      Peak Flow --      Pain Score 09/27/20 1008 4     Pain Loc --      Pain Edu? --      Excl. in [place]? --    No data found.  Updated Vital Signs BP 134/87 (BP Location: Left Arm)   Pulse (!) 103   Temp 98.8 F (37.1 C) (Oral)   Resp 18   Ht 5\' 5"  (1.651 m)   Wt 142 lb (64.4 kg)   SpO2 97%   BMI 23.63 kg/m   Physical Exam Vitals and nursing note reviewed.  Constitutional:      General: She is not in acute distress.    Appearance: Normal appearance. She is normal weight. She is ill-appearing.  HENT:     Head: Normocephalic and atraumatic.     Right Ear: Tympanic membrane, ear canal and external ear normal.     Left Ear: Tympanic membrane, ear canal and external ear normal.     Mouth/Throat:     Mouth: Mucous membranes are moist.     Pharynx: Oropharynx is clear.     Comments: Moderate amount of clear drainage of posterior oropharynx noted Eyes:     Extraocular Movements: Extraocular movements intact.     Conjunctiva/sclera: Conjunctivae normal.     Pupils: Pupils are equal, round, and reactive to light.  Cardiovascular:     Rate and Rhythm: Normal rate and regular rhythm.     Pulses: Normal pulses.     Heart sounds: Normal heart sounds. No murmur heard. Pulmonary:     Effort: Pulmonary effort is normal.     Breath sounds: No wheezing, rhonchi or rales.     Comments: Diminished breath sounds bibasilarly, infrequent nonproductive cough noted on exam Musculoskeletal:        General: Normal range of motion.     Cervical back: Normal range of motion and neck supple. Tenderness present.  Lymphadenopathy:     Cervical: Cervical adenopathy present.  Skin:    General: Skin is  warm and dry.  Neurological:     General: No focal deficit present.     Mental Status: She is alert and oriented to person, place, and time. Mental status is at baseline.  Psychiatric:        Mood and Affect: Mood normal.        Behavior: Behavior normal.  Thought Content: Thought content normal.     UC Treatments / Results  Labs (all labs ordered are listed, but only abnormal results are displayed) Labs Reviewed - No data to display  EKG   Radiology No results found.  Procedures Procedures (including critical care time)  Medications Ordered in UC Medications - No data to display  Initial Impression / Assessment and Plan / UC Course  I have reviewed the triage vital signs and the nursing notes.  Pertinent labs & imaging results that were available during my care of the patient were reviewed by me and considered in my medical decision making (see chart for details).     MDM: 1.  Cough-Rx'd Medrol Dosepak; 2.  Subacute maxillary sinusitis Rx'd Cefdinir; 3.  Allergic rhinitis, unspecified seasonality, unspecified trigger-Rx'd Allegra. Advised/instructed patient to take medication as directed with food to completion.  Advised/instructed patient to discontinue current Zyrtec and take Allegra daily for the next 7 days, then as needed.  Encouraged patient to increase daily water intake while taking these medications.  Patient discharged home, hemodynamically stable. Final Clinical Impressions(s) / UC Diagnoses   Final diagnoses:  Cough  Subacute maxillary sinusitis  Allergic rhinitis, unspecified seasonality, unspecified trigger     Discharge Instructions      Advised/instructed patient to take medication as directed with food to completion.  Advised/instructed patient to discontinue current Zyrtec and take Allegra daily for the next 7 days, then as needed.  Encouraged patient to increase daily water intake while taking these medications.     ED Prescriptions      Medication Sig Dispense Auth. Provider   cefdinir (OMNICEF) 300 MG capsule Take 1 capsule (300 mg total) by mouth 2 (two) times daily for 7 days. 14 capsule Eliezer Lofts, FNP   methylPREDNISolone (MEDROL DOSEPAK) 4 MG TBPK tablet Take as directed 1 each Eliezer Lofts, FNP   fexofenadine [facility] ALLERGY) 180 MG tablet Take 1 tablet (180 mg total) by mouth daily for 15 days. 15 tablet Eliezer Lofts, FNP      PDMP not reviewed this encounter.   Eliezer Lofts, [place] 09/27/20 1104

## 2020-09-27 NOTE — Discharge Instructions (Addendum)
Advised/instructed patient to take medication as directed with food to completion.  Advised/instructed patient to discontinue current Zyrtec and take Allegra daily for the next 7 days, then as needed.  Encouraged patient to increase daily water intake while taking these medications.

## 2020-09-27 NOTE — ED Triage Notes (Signed)
Dry cough x 10 days Sob X 2 days. Has asthma. Vaccinated, has had Covid x 3

## 2020-10-15 ENCOUNTER — Other Ambulatory Visit: Payer: Self-pay

## 2020-10-15 ENCOUNTER — Emergency Department
Admission: RE | Admit: 2020-10-15 | Discharge: 2020-10-15 | Disposition: A | Discharge status: Home / Self Care | Payer: Medicare HMO | Source: Ambulatory Visit

## 2020-10-15 ENCOUNTER — Emergency Department (INDEPENDENT_AMBULATORY_CARE_PROVIDER_SITE_OTHER): Payer: Medicare HMO

## 2020-10-15 VITALS — BP 158/83 | HR 75 | Temp 98.5°F | Resp 20 | Ht 65.0 in | Wt 142.0 lb

## 2020-10-15 DIAGNOSIS — R059 Cough, unspecified: Secondary | ICD-10-CM

## 2020-10-15 DIAGNOSIS — R0781 Pleurodynia: Secondary | ICD-10-CM

## 2020-10-15 MED ORDER — BENZONATATE 200 MG PO CAPS: 200.0000 mg | ORAL_CAPSULE | Freq: Three times a day (TID) | ORAL | 0 refills | Status: AC | PRN

## 2020-10-15 NOTE — ED Triage Notes (Addendum)
Pt presents to Urgent Care with c/o cough and L chest/rib pain which hurts worse w/ deep breath x approx 3 days. Denies nausea and L arm pain.Reports having respiratory infection in September; states she took medication and was feeling better for approx 5 days before symptoms returned.  Has not done recent COVID test, but states she has had COVID 3 times and has been vaccinated.

## 2020-10-15 NOTE — ED Provider Notes (Signed)
[facility]    CSN: [id number] Arrival date & time: 10/15/20  0940      History   Chief Complaint Chief Complaint  Patient presents with   Cough   Chest Pain    HPI Sheryl Freeman is a 75 y.o. female.   HPI 75 year old female presents with cough and left chest/rib pain which hurts worse with deep breathing for 3 days.  Patient evaluated by me on 09/27/2020 please see that encounter note.  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 26/71/2458   Shortness of breath 01/24/2016   Postmenopausal 01/24/2016   Gastroesophageal reflux disease 01/24/2016   Chronic midline low back pain without sciatica 09/05/2015   Acne rosacea 08/30/2011   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      Home Medications    Prior to Admission medications   Medication Sig Start Date End Date Taking? Authorizing Provider  benzonatate (TESSALON) 200 MG capsule Take 1 capsule (200 mg total) by mouth 3 (three) times  daily as needed for up to 7 days for cough. 10/15/20 10/22/20 Yes Eliezer Lofts, FNP  dextromethorphan-guaiFENesin (MUCINEX DM) 30-600 MG 12hr tablet Take 1 tablet by mouth 2 (two) times daily.   Yes [provider]  albuterol (VENTOLIN HFA) 108 (90 Base) MCG/ACT inhaler Inhale 1-2 puffs into the lungs every 4 (four) hours as needed for wheezing or shortness of breath. 11/23/19   Emeterio Reeve, DO  Cetirizine HCl 10 MG CAPS Take by mouth.    [provider]  Coenzyme Q10 (COQ10) 50 MG CAPS Take 100 mg by mouth daily. 05/25/19   Emeterio Reeve, DO  conjugated estrogens (PREMARIN) vaginal cream Place 1 Applicatorful vaginally daily.    [provider]  famotidine (PEPCID) 20 MG tablet Take 20 mg by mouth daily.    [provider]  fexofenadine (ALLEGRA ALLERGY) 180 MG tablet Take 1 tablet (180 mg total) by mouth daily for 15 days. 09/27/20 10/12/20  Eliezer Lofts, FNP  hydrochlorothiazide (HYDRODIURIL) 12.5 MG tablet Take 1 tablet (12.5 mg total) by mouth daily. 11/23/19   Emeterio Reeve, DO  methylPREDNISolone (MEDROL DOSEPAK) 4 MG TBPK tablet Take as directed 09/27/20   Eliezer Lofts, FNP  omega-3 acid ethyl esters (LOVAZA) 1 g capsule Take 2 capsules (2 g total) by mouth 2 (two) times daily. 11/23/19 02/21/20  Emeterio Reeve, DO  simvastatin (ZOCOR) 40 MG tablet Take 1 tablet (40 mg total) by mouth daily. 11/23/19   Emeterio Reeve, DO  Spacer/Aero-Holding Chambers (E-Z SPACER) inhaler Use as instructed  07/21/18   Emeterio Reeve, DO    Family History Family History  Problem Relation Age of Onset   Alzheimer's disease Mother    Diabetes Father    Liver disease Father     Social History Social History   Tobacco Use   Smoking status: Never   Smokeless tobacco: Never  Vaping Use   Vaping Use: Never used  Substance Use Topics   Alcohol use: No   Drug use: No     Allergies   Codeine   Review of Systems Review of Systems   Respiratory:  Positive for cough.   All other systems reviewed and are negative.   Physical Exam Triage Vital Signs ED Triage Vitals  Enc Vitals Group     BP 10/15/20 1004 (!) 158/83     Pulse Rate 10/15/20 1004 75     Resp 10/15/20 1004 20     Temp 10/15/20 1004 98.5 F (36.9 C)     Temp Source 10/15/20 1004 Oral     SpO2 10/15/20 1004 97 %     Weight 10/15/20 0959 142 lb (64.4 kg)     Height 10/15/20 0959 5\' 5"  (1.651 m)     Head Circumference --      Peak Flow --      Pain Score 10/15/20 0959 5     Pain Loc --      Pain Edu? --      Excl. in [place]? --    No data found.  Updated Vital Signs BP (!) 158/83 (BP Location: Right Arm)   Pulse 75   Temp 98.5 F (36.9 C) (Oral)   Resp 20   Ht 5\' 5"  (1.651 m)   Wt 142 lb (64.4 kg)   SpO2 97%   BMI 23.63 kg/m      Physical Exam Vitals and nursing note reviewed.  Constitutional:      General: She is not in acute distress.    Appearance: Normal appearance. She is normal weight. She is not ill-appearing.  HENT:     Head: Normocephalic and atraumatic.     Right Ear: Tympanic membrane, ear canal and external ear normal.     Left Ear: Tympanic membrane, ear canal and external ear normal.     Mouth/Throat:     Mouth: Mucous membranes are moist.     Pharynx: Oropharynx is clear.  Eyes:     Extraocular Movements: Extraocular movements intact.     Conjunctiva/sclera: Conjunctivae normal.     Pupils: Pupils are equal, round, and reactive to light.  Cardiovascular:     Rate and Rhythm: Normal rate and regular rhythm.     Pulses: Normal pulses.     Heart sounds: Normal heart sounds.  Pulmonary:     Effort: Pulmonary effort is normal. No respiratory distress.     Breath sounds: Normal breath sounds. No stridor. No wheezing, rhonchi or rales.  Chest:     Chest wall: No tenderness.  Musculoskeletal:     Cervical back: Normal range of motion and neck supple.  Skin:    General: Skin is warm and dry.  Neurological:      General: No focal deficit present.     Mental Status: She is alert and oriented to person, place, and time. Mental status is at baseline.  Psychiatric:        Mood and Affect: Mood normal.        Behavior: Behavior normal.     UC Treatments / Results  Labs (all labs ordered are listed, but only abnormal results are displayed) Labs Reviewed - No data to display  EKG   Radiology DG Chest 2 View  Result Date: 10/15/2020 CLINICAL DATA:  75 year old female with cough. Left chest and rib pain. Pleuritic pain. EXAM: CHEST - 2 VIEW COMPARISON:  Chest radiograph 06/12/2016 and earlier. FINDINGS: Lung volumes and mediastinal contours are stable and within normal limits. Visualized tracheal air column is within normal limits. Stable lung markings since 2018. No pneumothorax, pulmonary edema, pleural effusion or acute pulmonary opacity. No acute osseous abnormality identified. Negative visible bowel gas pattern. IMPRESSION: No acute cardiopulmonary abnormality. Electronically Signed   By: Genevie Ann M.D.   On: 10/15/2020 10:30    Procedures Procedures (including critical care time)  Medications Ordered in UC Medications - No data to display  Initial Impression / Assessment and Plan / UC Course  I have reviewed the triage vital signs and the nursing notes.  Pertinent labs & imaging results that were available during my care of the patient were reviewed by me and considered in my medical decision making (see chart for details).     MDM: 1. Cough, CXR revealed above PMH of upper airway cough syndrome versus cough variant asthma/pleuritic pain.  Rx'd [employer] Patient to follow-up with her pulmonologist on October 26, 2020.  Patient discharged home, hemodynamically stable.  Final Clinical Impressions(s) / UC Diagnoses   Final diagnoses:  Cough, unspecified type     Discharge Instructions      Advised/encouraged patient to follow-up with Pulmonologist  for further evaluation of  chronic cough.  Advised patient may use Tessalon Perles daily, or as needed for cough.     ED Prescriptions     Medication Sig Dispense Auth. Provider   benzonatate (TESSALON) 200 MG capsule Take 1 capsule (200 mg total) by mouth 3 (three) times daily as needed for up to 7 days for cough. 30 capsule Eliezer Lofts, FNP      PDMP not reviewed this encounter.   Eliezer Lofts, [place] 10/15/20 1058

## 2020-10-15 NOTE — Discharge Instructions (Addendum)
Advised/encouraged patient to follow-up with Pulmonologist  for further evaluation of chronic cough.  Advised patient may use Tessalon Perles daily, or as needed for cough.

## 2020-10-17 ENCOUNTER — Other Ambulatory Visit: Payer: Self-pay | Admitting: Osteopathic Medicine

## 2020-10-26 ENCOUNTER — Ambulatory Visit: Payer: Medicare HMO | Admitting: Internal Medicine

## 2020-10-26 ENCOUNTER — Encounter: Payer: Self-pay | Admitting: Internal Medicine

## 2020-10-26 ENCOUNTER — Other Ambulatory Visit: Payer: Self-pay

## 2020-10-26 VITALS — BP 134/78 | HR 74 | Temp 98.2°F | Ht 65.0 in | Wt 145.0 lb

## 2020-10-26 DIAGNOSIS — Z23 Encounter for immunization: Secondary | ICD-10-CM

## 2020-10-26 DIAGNOSIS — R058 Other specified cough: Secondary | ICD-10-CM

## 2020-10-26 LAB — NITRIC OXIDE: Nitric Oxide: 22

## 2020-10-26 LAB — CBC WITH DIFFERENTIAL/PLATELET
Basophils Absolute: 0.1 10*3/uL (ref 0.0–0.1)
Basophils Relative: 0.9 % (ref 0.0–3.0)
Eosinophils Absolute: 0.2 10*3/uL (ref 0.0–0.7)
Eosinophils Relative: 2.1 % (ref 0.0–5.0)
HCT: 43.1 % (ref 36.0–46.0)
Hemoglobin: 14.3 g/dL (ref 12.0–15.0)
Lymphocytes Relative: 32.6 % (ref 12.0–46.0)
Lymphs Abs: 2.3 10*3/uL (ref 0.7–4.0)
MCHC: 33.2 g/dL (ref 30.0–36.0)
MCV: 94.6 fl (ref 78.0–100.0)
Monocytes Absolute: 0.7 10*3/uL (ref 0.1–1.0)
Monocytes Relative: 9.4 % (ref 3.0–12.0)
Neutro Abs: 3.9 10*3/uL (ref 1.4–7.7)
Neutrophils Relative %: 55 % (ref 43.0–77.0)
Platelets: 226 10*3/uL (ref 150.0–400.0)
RBC: 4.56 Mil/uL (ref 3.87–5.11)
RDW: 13.4 % (ref 11.5–15.5)
WBC: 7.1 10*3/uL (ref 4.0–10.5)

## 2020-10-26 MED ORDER — BUDESONIDE-FORMOTEROL FUMARATE 80-4.5 MCG/ACT IN AERO: INHALATION_SPRAY | RESPIRATORY_TRACT | 12 refills | Status: DC

## 2020-10-26 NOTE — Patient Instructions (Addendum)
When you have your next flare:  Try immediately prilosec otc 20mg   Take 30-60 min before first meal of the day and Pepcid ac (famotidine) 20 mg one @  bedtime until cough is completely gone for at least a week without the need for cough suppression  Start symbicort 80 Take 2 puffs first thing in am and then another 2 puffs about 12 hours later.   Work on inhaler technique:  relax and gently blow all the way out then take a nice smooth full deep breath back in, triggering the inhaler a split second before you start breathing in thru the spacer.  Hold for up to 5 seconds if you can. Blow out thru nose. Rinse and gargle with water when done.  If mouth or throat bother you at all,  try brushing teeth/gums/tongue with arm and hammer toothpaste/ make a slurry and gargle and spit out.      In between: Only use your albuterol as a rescue medication to be used if you can't catch your breath by resting or doing a relaxed purse lip breathing pattern.  - The less you use it, the better it will work when you need it. - Ok to use up to 2 puffs  every 4 hours if you must but call for immediate appointment if use goes up over your usual need - Don't leave home without it !!  (think of it like the spare tire for your car)   Please schedule a follow up visit in 3 months but call sooner if needed

## 2020-10-26 NOTE — Progress Notes (Signed)
Subjective:     Patient ID: Sheryl Freeman, female   DOB: 08/12/1945    MRN: [id number]    Brief patient profile:  5 yowf never smoker with "whooping cough" as infant but healthy childhood in [place] and good ex tolerance and did fine until in her late 52s noted  cough with typical colds in winter would last longer than her peers up to 6 weeks at a time assoc with sob  best rx was narcotic containing cough meds but then Oct 2017 had recurrent cough that came on like the other episodes and never let up so referred to pulmonary clinic 06/12/2016 by Dr   Sheppard Coil     History of Present Illness  06/12/2016 1st [facility] office visit/ Sheryl Freeman   Chief Complaint  Patient presents with   Pulmonary Consult    Referred by Dr. Emeterio Reeve. [name] for the past 8 months. She states she is SOB when the weather is damp and cold and also when she gets stressed. She also c/o occ non prod cough. She is using an albuterol inhaler 1x daily on average.   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                            With trouble breathing? Rarely never   Do you have choking episodes when you cannot  Get enough air, gasping for air ?              Yes def    Do you usually cough when you lie down into  The bed, or when you just lie down to rest ?                          Sometimes not every noct    Do you usually cough after meals or eating?         Yes   Do you cough when (or after) you bend over?    sometimes   GERD SCORE     Kouffman Reflux v Neurogenic Cough Differentiator  Neurogenic   Do you more-or-less cough all day long? yes   Does change of temperature make you cough? Winter yes   Does laughing or chuckling cause you to cough? no   Do fumes (perfume, automobile fumes, burned  Toast, etc.,) cause you to cough ?      no   Does speaking, singing, or talking on the phone cause you to cough   ?               Sometimes    Neurogenic/Airway score    Unless coughing really  Not limited by breathing from desired activities   rec Stop flovent Only use your albuterol (ventolin) as a rescue medication  Pantoprazole (protonix) 40 mg   Take  30-60 min before first meal of the day and Pepcid (famotidine)  20 mg one after   Supper  until return to office - GERD diet   It can be treated with medication  For drainage / throat tickle try take CHLORPHENIRAMINE  4 mg - take one every 4 hours as needed - available over the counter- may cause drowsiness so start with just a bedtime dose or two and see how you tolerate it before trying in daytime   Tessalon 200 mg three times daily for cough  Prednisone 10 mg take  4 each am x 2 days,   2 each am x 2 days,  1 each am x 2 days and stop     04/29/2017  f/u ov/Sheryl Freeman re:  asthma vs uacs resolved on arnuity  Chief Complaint  Patient presents with   Follow-up    Needing refill of pantoprazole.    Dyspnea:  Not limited by breathing from desired activities   Cough: not now Sleep: fine now  SABA use:  When cold and damp seems to need it   Prednisone = 100% better in every way and stayed better p started  arnuity While on protonix the hb resolved then pepcid before bfast before supper has some hb but no cough and would like refill. Rec I am happy the cough has resolved to your satisfaction on arnuity but blow it out thru your nose and let me know if your cough returns or lasts more than a couple of weeks in event of a "cold"  Change Pepcid 20 mg to where you take it after bfast and after supper consistently and if not better I  would rather you see a GI doctor for refills of protonix  GERD diet reviewed, bed blocks rec    10/26/2020  f/u ov/Sheryl Freeman re: asthma vs uacs    maint on pepcid 20 mg after supper but not typically twice daily on avg bid 1st gen H1 blockers per guidelines and still flaring in late spring and late august each year and has to stay inside for weeks  Chief Complaint  Patient presents with   Pulmonary Consult    Re est care- last seen in 2019. She states had viral infection 09/18/20- has had cough and SOB off and on since then. She is using her albuterol about 2 x per day. Her cough is prod with clear sputum.   09/27/20 rx omnicef, prednisone, allegra - couple of weeks off prednisone   Dyspnea:  denies  Cough: better  Sleeping: flat / big pillow no resp cc's SABA use: rarely needing until Sept 11 and since then at least twice  02: none  Covid status:   vax x 3  Last alb > 12 h prior to OV     No obvious day to day or daytime variability or assoc excess/ purulent sputum or mucus plugs or hemoptysis or cp or chest tightness, subjective wheeze or overt sinus or hb symptoms.   Sleeping most nights now without nocturnal  or early am exacerbation  of respiratory  c/o's or need for noct saba. Also denies any obvious fluctuation of symptoms with weather or environmental changes or other aggravating or alleviating factors except as outlined above   No unusual exposure hx or h/o childhood pna/ asthma or knowledge of premature birth.  Current Allergies, Complete Past Medical History, Past Surgical History, Family History, and Social History were reviewed in [employer] record.  ROS  The following are not active complaints unless bolded Hoarseness, sore throat, dysphagia, dental problems, itching, sneezing,  nasal congestion or discharge of excess  mucus or purulent secretions, ear ache,   fever, chills, sweats, unintended wt loss or wt gain, classically pleuritic or exertional cp,   orthopnea pnd or arm/hand swelling  or leg swelling, presyncope, palpitations, abdominal pain, anorexia, nausea, vomiting, diarrhea  or change in bowel habits or change in bladder habits, change in stools or change in urine, dysuria, hematuria,  rash, arthralgias, visual complaints, headache, numbness, weakness or ataxia or problems with walking or coordination,  change in mood or  memory.        Current Meds  Medication Sig   albuterol (VENTOLIN HFA) 108 (90 Base) MCG/ACT inhaler Inhale 1-2 puffs into the lungs every 4 (four) hours as needed for wheezing or shortness of breath.   chlorpheniramine (CHLOR-TRIMETON) 4 MG tablet Take 4 mg by mouth 2 (two) times daily as needed for allergies.   Coenzyme Q10 (COQ10) 50 MG CAPS Take 100 mg by mouth daily.   estrogens, conjugated, (PREMARIN) 0.625 MG tablet Take 0.625 mg by mouth daily as needed.   famotidine (PEPCID) 20 MG tablet Take 20 mg by mouth daily.   guaiFENesin (MUCINEX) 600 MG 12 hr tablet Take 600 mg by mouth 2 (two) times daily as needed.   hydrochlorothiazide (HYDRODIURIL) 12.5 MG tablet Take 1 tablet (12.5 mg total) by mouth daily.   simvastatin (ZOCOR) 40 MG tablet Take 1 tablet (40 mg total) by mouth daily.   Sodium Chloride-Sodium Bicarb (AYR SALINE NASAL RINSE NA) Place into the nose as needed.   Spacer/Aero-Holding Chambers (E-Z SPACER) inhaler Use as instructed                         Objective:   Physical Exam    10/26/2020     145   04/29/2017      147   06/12/16 147 lb (66.7 kg)  05/19/16 150 lb (68 kg)  04/25/16 155 lb (70.3 kg)     Vital signs reviewed  10/26/2020  - Note at rest 02 sats  96% on RA   General appearance:    pleasant amb wf / somewhat congested sounding cough    HEENT : pt wearing mask not removed for exam due to covid -19 concerns.    NECK :  without JVD/Nodes/TM/ nl carotid upstrokes bilaterally   LUNGS: no acc muscle use,  Nl contour chest which is clear to A and P bilaterally  without cough on insp or exp maneuvers   CV:  RRR  no s3 or murmur or increase in P2, and no edema   ABD:  soft and nontender with nl inspiratory excursion in the supine position. No bruits or organomegaly appreciated, bowel sounds nl  MS:  Nl gait/ ext warm without deformities, calf tenderness, cyanosis or clubbing No obvious joint restrictions   SKIN: warm and dry without lesions    NEURO:  alert, approp, nl sensorium with  no motor or cerebellar deficits apparent.      Assessment:

## 2020-10-26 NOTE — Assessment & Plan Note (Addendum)
Onset ? Early 2000's with "colds" FENO 06/12/2016  =   Unable to perform  Arnuity rx 12/06/16 >  Cough resolved at f/u pulmonary ov 04/29/2017  - Allergy profile 04/29/2017 >  Eos 0.1 /  IgE  11 RAST neg  - FENO 10/26/2020 = 22  p flare in sept 2022  rx prednisone completed 2 weeks prior - Allergy profile 10/26/2020 >  Eos 0.2 /  IgE  Pending    - 10/26/2020  After extensive coaching inhaler device,  effectiveness =    50% via spacer try symbicort 80 2bid prn flare Based on two studies from NEJM  378; 20 p 1865 (2018) and 380 : p2020-30 (2019) in pts with mild asthma it is reasonable to use low dose symbicort eg 80 2bid "prn" flare in this setting but I emphasized this was only shown with symbicort and takes advantage of the rapid onset of action but is not the same as "rescue therapy" but can be stopped once the acute symptoms have resolved and the need for rescue has been  minimized (< 2 x weekly)    - also for flare:  Try prilosec otc 20mg   Take 30-60 min before first meal of the day and Pepcid ac (famotidine) 20 mg one @  bedtime until cough is completely gone for at least a week without the need for cough suppression then resume just pepcid 20 mg q day       >>> f/u in 3 m, call sooner if needed          Each maintenance medication was reviewed in detail including emphasizing most importantly the difference between maintenance and prns and under what circumstances the prns are to be triggered using an action plan format where appropriate.  Total time for H and P, chart review, counseling, reviewing hfa device(s) and generating customized AVS unique to this office visit with pt new since last seen > 3 y prior to [place]   / same day charting = 36 min

## 2020-10-27 LAB — IGE: IgE (Immunoglobulin E), Serum: 12 kU/L (ref <=114)

## 2020-11-22 ENCOUNTER — Other Ambulatory Visit: Payer: Self-pay | Admitting: Osteopathic Medicine

## 2020-11-28 ENCOUNTER — Other Ambulatory Visit: Payer: Medicare HMO

## 2020-12-05 ENCOUNTER — Encounter: Payer: Medicare HMO | Admitting: Family Medicine

## 2020-12-05 ENCOUNTER — Encounter: Payer: Medicare HMO | Admitting: Osteopathic Medicine

## 2020-12-06 ENCOUNTER — Other Ambulatory Visit: Payer: Self-pay | Admitting: *Deleted

## 2020-12-06 MED ORDER — SIMVASTATIN 40 MG PO TABS: 40.0000 mg | ORAL_TABLET | Freq: Every day | ORAL | 0 refills | Status: DC

## 2020-12-13 ENCOUNTER — Other Ambulatory Visit: Payer: Self-pay

## 2020-12-13 ENCOUNTER — Ambulatory Visit (INDEPENDENT_AMBULATORY_CARE_PROVIDER_SITE_OTHER): Payer: Medicare HMO | Admitting: Medical-Surgical

## 2020-12-13 VITALS — BP 145/74 | HR 68 | Ht 65.0 in | Wt 149.1 lb

## 2020-12-13 DIAGNOSIS — Z0000 Encounter for general adult medical examination without abnormal findings: Secondary | ICD-10-CM | POA: Diagnosis not present

## 2020-12-13 DIAGNOSIS — Z23 Encounter for immunization: Secondary | ICD-10-CM | POA: Diagnosis not present

## 2020-12-13 NOTE — Patient Instructions (Addendum)
[facility] Maintenance Summary and Written Plan of Care  Sheryl Freeman ,  Thank you for allowing me to perform your Medicare Annual Wellness Visit and for your ongoing commitment to your health.   Health Maintenance & Immunization History Health Maintenance  Topic Date Due   TETANUS/TDAP  12/13/2021 (Originally 07/16/2020)   Hepatitis C Screening  12/13/2021 (Originally 12/08/1963)   MAMMOGRAM  09/15/2022   COLONOSCOPY (Pts 45-49yrs Insurance coverage will need to be confirmed)  09/30/2024   Pneumonia Vaccine 37+ Years old  Completed   INFLUENZA VACCINE  Completed   DEXA SCAN  Completed   COVID-19 Vaccine  Completed   Zoster Vaccines- Shingrix  Completed   HPV VACCINES  Aged Out   Immunization History  Administered Date(s) Administered   Fluad Quad(high Dose 65+) 10/09/2018, 10/26/2020   Influenza, High Dose Seasonal PF 10/29/2016, 10/08/2017   Influenza, Seasonal, Injecte, Preservative Fre 11/11/2012, 11/11/2013   Influenza,trivalent, recombinat, inj, PF 11/03/2011   Influenza-Unspecified 11/03/2014, 11/09/2015, 10/29/2016, 10/12/2019   Moderna Covid-19 Vaccine Bivalent Booster 51yrs & up 11/18/2020   Moderna Sars-Covid-2 Vaccination 02/09/2019, 03/10/2019, 10/31/2019   PNEUMOCOCCAL CONJUGATE-20 12/13/2020   Pneumococcal Polysaccharide-23 08/30/2011   Tdap 07/17/2010   Zoster Recombinat (Shingrix) 07/23/2017, 01/13/2018   Zoster, Live 09/16/2007    These are the patient goals that we discussed:  Goals Addressed               This Visit's Progress     Patient Stated (pt-stated)        12/13/2020 AWV Goal: Exercise for General Health  Patient will verbalize understanding of the benefits of increased physical activity: Exercising regularly is important. It will improve your overall fitness, flexibility, and endurance. Regular exercise also will improve your overall health. It can help you control your weight, reduce stress, and improve your bone  density. Over the next year, patient will increase physical activity as tolerated with a goal of at least 150 minutes of moderate physical activity per week.  You can tell that you are exercising at a moderate intensity if your heart starts beating faster and you start breathing faster but can still hold a conversation. Moderate-intensity exercise ideas include: Walking 1 mile (1.6 km) in about 15 minutes Biking Hiking Golfing Dancing Water aerobics Patient will verbalize understanding of everyday activities that increase physical activity by providing examples like the following: Yard work, such as: Sales promotion account executive Gardening Washing windows or floors Patient will be able to explain general safety guidelines for exercising:  Before you start a new exercise program, talk with your health care provider. Do not exercise so much that you hurt yourself, feel dizzy, or get very short of breath. Wear comfortable clothes and wear shoes with good support. Drink plenty of water while you exercise to prevent dehydration or heat stroke. Work out until your breathing and your heartbeat get faster.          This is a list of Health Maintenance Items that are overdue or due now: Pneumococcal vaccine  Td vaccine  Orders/Referrals Placed Today: Orders Placed This Encounter  Procedures   Pneumococcal conjugate vaccine 20-valent (Prevnar 20)   (Contact our referral department at [phone] if you have not spoken with someone about your referral appointment within the next 5 days)    Follow-up Plan Follow-up with No primary care provider on file. as planned Schedule your tetanus shot at your pharmacy.  Medicare wellness visit in one year.  AVS printed and given to the patient.     Health Maintenance, Female Adopting a healthy lifestyle and getting preventive care are important in promoting health and  wellness. Ask your health care provider about: The right schedule for you to have regular tests and exams. Things you can do on your own to prevent diseases and keep yourself healthy. What should I know about diet, weight, and exercise? Eat a healthy diet  Eat a diet that includes plenty of vegetables, fruits, low-fat dairy products, and lean protein. Do not eat a lot of foods that are high in solid fats, added sugars, or sodium. Maintain a healthy weight Body mass index (BMI) is used to identify weight problems. It estimates body fat based on height and weight. Your health care provider can help determine your BMI and help you achieve or maintain a healthy weight. Get regular exercise Get regular exercise. This is one of the most important things you can do for your health. Most adults should: Exercise for at least 150 minutes each week. The exercise should increase your heart rate and make you sweat (moderate-intensity exercise). Do strengthening exercises at least twice a week. This is in addition to the moderate-intensity exercise. Spend less time sitting. Even light physical activity can be beneficial. Watch cholesterol and blood lipids Have your blood tested for lipids and cholesterol at 74 years of age, then have this test every 5 years. Have your cholesterol levels checked more often if: Your lipid or cholesterol levels are high. You are older than 74 years of age. You are at high risk for heart disease. What should I know about cancer screening? Depending on your health history and family history, you may need to have cancer screening at various ages. This may include screening for: Breast cancer. Cervical cancer. Colorectal cancer. Skin cancer. Lung cancer. What should I know about heart disease, diabetes, and high blood pressure? Blood pressure and heart disease High blood pressure causes heart disease and increases the risk of stroke. This is more likely to develop in people  who have high blood pressure readings or are overweight. Have your blood pressure checked: Every 3-5 years if you are 68-30 years of age. Every year if you are 56 years old or older. Diabetes Have regular diabetes screenings. This checks your fasting blood sugar level. Have the screening done: Once every three years after age 49 if you are at a normal weight and have a low risk for diabetes. More often and at a younger age if you are overweight or have a high risk for diabetes. What should I know about preventing infection? Hepatitis B If you have a higher risk for hepatitis B, you should be screened for this virus. Talk with your health care provider to find out if you are at risk for hepatitis B infection. Hepatitis C Testing is recommended for: Everyone born from [birth year] through 1964. Anyone with known risk factors for hepatitis C. Sexually transmitted infections (STIs) Get screened for STIs, including gonorrhea and chlamydia, if: You are sexually active and are younger than 74 years of age. You are older than 74 years of age and your health care provider tells you that you are at risk for this type of infection. Your sexual activity has changed since you were last screened, and you are at increased risk for chlamydia or gonorrhea. Ask your health care provider if you are at risk. Ask your health care provider about whether you  are at high risk for HIV. Your health care provider may recommend a prescription medicine to help prevent HIV infection. If you choose to take medicine to prevent HIV, you should first get tested for HIV. You should then be tested every 3 months for as long as you are taking the medicine. Pregnancy If you are about to stop having your period (premenopausal) and you may become pregnant, seek counseling before you get pregnant. Take 400 to 800 micrograms (mcg) of folic acid every day if you become pregnant. Ask for birth control (contraception) if you want to prevent  pregnancy. Osteoporosis and menopause Osteoporosis is a disease in which the bones lose minerals and strength with aging. This can result in bone fractures. If you are 46 years old or older, or if you are at risk for osteoporosis and fractures, ask your health care provider if you should: Be screened for bone loss. Take a calcium or vitamin D supplement to lower your risk of fractures. Be given hormone replacement therapy (HRT) to treat symptoms of menopause. Follow these instructions at home: Alcohol use Do not drink alcohol if: Your health care provider tells you not to drink. You are pregnant, may be pregnant, or are planning to become pregnant. If you drink alcohol: Limit how much you have to: 0-1 drink a day. Know how much alcohol is in your drink. In the [place] one drink equals one 12 oz bottle of beer (355 mL), one 5 oz glass of wine (148 mL), or one 1 oz glass of hard liquor (44 mL). Lifestyle Do not use any products that contain nicotine or tobacco. These products include cigarettes, chewing tobacco, and vaping devices, such as e-cigarettes. If you need help quitting, ask your health care provider. Do not use street drugs. Do not share needles. Ask your health care provider for help if you need support or information about quitting drugs. General instructions Schedule regular health, dental, and eye exams. Stay current with your vaccines. Tell your health care provider if: You often feel depressed. You have ever been abused or do not feel safe at home. Summary Adopting a healthy lifestyle and getting preventive care are important in promoting health and wellness. Follow your health care provider's instructions about healthy diet, exercising, and getting tested or screened for diseases. Follow your health care provider's instructions on monitoring your cholesterol and blood pressure. This information is not intended to replace advice given to you by your health care provider.  Make sure you discuss any questions you have with your health care provider. Document Revised: 05/16/2020 Document Reviewed: 05/16/2020 Elsevier Patient Education  [place].

## 2020-12-13 NOTE — Progress Notes (Signed)
MEDICARE ANNUAL WELLNESS VISIT  12/13/2020  Subjective:  Sheryl Freeman is a 75 y.o. female patient of No primary care provider on file. who had a [employer] Visit today. Sheryl Freeman is Retired and lives with their spouse. she has 3 children. she reports that she is socially active and does interact with friends/family regularly. she is moderately physically active and enjoys traveling.  No care team member to display  Advanced Directives 12/13/2020 01/24/2016  Does Patient Have a Medical Advance Directive? Yes Yes  Type of Advance Directive Living will;Healthcare Power of Attorney Living will;Healthcare Power of [place];Out of facility DNR (pink MOST or yellow form)  Does patient want to make changes to medical advance directive? No - Patient declined -  Copy of [place] in Chart? No - copy requested No - copy requested    Hospital Utilization Over the Past 12 Months: # of hospitalizations or ER visits: 0 # of surgeries: 0  Review of Systems    Patient reports that her overall health is better when compared to last year.  Review of Systems: History obtained from chart review and the patient  All other systems negative.  Pain Assessment Pain : No/denies pain     Current Medications & Allergies (verified) Allergies as of 12/13/2020       Reactions   Codeine Itching        Medication List        Accurate as of December 13, 2020 11:40 AM. If you have any questions, ask your nurse or doctor.          albuterol 108 (90 Base) MCG/ACT inhaler Commonly known as: VENTOLIN HFA Inhale 1-2 puffs into the lungs every 4 (four) hours as needed for wheezing or shortness of breath.   AYR SALINE NASAL RINSE NA Place into the nose as needed.   budesonide-formoterol 80-4.5 MCG/ACT inhaler Commonly known as: Symbicort Take 2 puffs first thing in am and then another 2 puffs about 12 hours later.   chlorpheniramine 4 MG tablet Commonly known as:  CHLOR-TRIMETON Take 4 mg by mouth 2 (two) times daily as needed for allergies.   CoQ10 50 MG Caps Take 100 mg by mouth daily.   E-Z Spacer inhaler Use as instructed   estrogens (conjugated) 0.625 MG tablet Commonly known as: PREMARIN Take 0.625 mg by mouth daily as needed.   famotidine 20 MG tablet Commonly known as: PEPCID Take 20 mg by mouth daily.   guaiFENesin 600 MG 12 hr tablet Commonly known as: MUCINEX Take 600 mg by mouth 2 (two) times daily as needed.   hydrochlorothiazide 12.5 MG tablet Commonly known as: HYDRODIURIL Take 1 tablet (12.5 mg total) by mouth daily. NO REFILLS. NEEDS AN ANNUAL APPT WITH NEW PCP.   simvastatin 40 MG tablet Commonly known as: ZOCOR Take 1 tablet (40 mg total) by mouth daily. APPOINTMENT WITH LABS REQUIRED FOR REFILLS        History (reviewed): Past Medical History:  Diagnosis Date   Asthma    Frequent UTI    High cholesterol    Past Surgical History:  Procedure Laterality Date   BREAST EXCISIONAL BIOPSY     Family History  Problem Relation Age of Onset   Alzheimer's disease Mother    Diabetes Father    Liver disease Father    Social History   Socioeconomic History   Marital status: Married    Spouse name: Cameron Katayama   Number of children: 3   Years  of education: 14   Highest education level: Associate degree: academic program  Occupational History   Occupation: Retired  Tobacco Use   Smoking status: Never   Smokeless tobacco: Never  Vaping Use   Vaping Use: Never used  Substance and Sexual Activity   Alcohol use: No   Drug use: No   Sexual activity: Not on file  Other Topics Concern   Not on file  Social History Narrative   Lives with her husband. She has three children and two of them live close by. She enjoys traveling and going to church.   Social Determinants of Health   Financial Resource Strain: Low Risk    Difficulty of Paying Living Expenses: Not hard at all  Food Insecurity: No Food Insecurity    Worried About Charity fundraiser in the Last Year: Never true   [place] in the Last Year: Never true  Transportation Needs: No Transportation Needs   Lack of Transportation (Medical): No   Lack of Transportation (Non-Medical): No  Physical Activity: Insufficiently Active   Days of Exercise per Week: 3 days   Minutes of Exercise per Session: 30 min  Stress: No Stress Concern Present   Feeling of Stress : Not at all  Social Connections: Socially Integrated   Frequency of Communication with Friends and Family: More than three times a week   Frequency of Social Gatherings with Friends and Family: Three times a week   Attends Religious Services: More than 4 times per year   Active Member of Clubs or Organizations: Yes   Attends Archivist Meetings: More than 4 times per year   Marital Status: Married    Activities of Daily Living In your present state of health, do you have any difficulty performing the following activities: 12/13/2020  Hearing? N  Vision? N  Difficulty concentrating or making decisions? N  Walking or climbing stairs? N  Dressing or bathing? N  Doing errands, shopping? N  Preparing Food and eating ? N  Using the Toilet? N  In the past six months, have you accidently leaked urine? N  Do you have problems with loss of bowel control? N  Managing your Medications? N  Managing your Finances? N  Housekeeping or managing your Housekeeping? N  Some recent data might be hidden    Patient Education/Literacy How often do you need to have someone help you when you read instructions, pamphlets, or other written materials from your doctor or pharmacy?: 1 - Never What is the last grade level you completed in school?: Associates Degree  Exercise Current Exercise Habits: Home exercise routine, Type of exercise: walking, Time (Minutes): 30, Frequency (Times/Week): 3, Weekly Exercise (Minutes/Week): 90, Intensity: Moderate, Exercise limited by: respiratory  conditions(s)  Diet Patient reports consuming  2-3  meals a day and 2 snack(s) a day Patient reports that her primary diet is: Regular Patient reports that she does have regular access to food.   Depression Screen PHQ 2/9 Scores 12/13/2020 11/23/2019 11/25/2018 01/13/2018 01/24/2016  PHQ - 2 Score 0 - 0 0 0  Exception Documentation - Patient refusal - - -     Fall Risk Fall Risk  12/13/2020 11/23/2019  Falls in the past year? 0 0  Number falls in past yr: 0 -  Injury with Fall? 0 -  Risk for fall due to : No Fall Risks -  Follow up Falls evaluation completed Falls evaluation completed     Objective:   BP Marland Kitchen)  145/74 (BP Location: Left Arm, Patient Position: Sitting, Cuff Size: Normal)   Pulse 68   Ht 5\' 5"  (1.651 m)   Wt 149 lb 1.9 oz (67.6 kg)   SpO2 96%   BMI 24.81 kg/m   Last Weight  Most recent update: 12/13/2020 11:07 AM    Weight  67.6 kg (149 lb 1.9 oz)             Body mass index is 24.81 kg/m.  Hearing/Vision  Lorelee did not have difficulty with hearing/understanding during the face-to-face interview Kayson did not have difficulty with her vision during the face-to-face interview Reports that she has had a formal eye exam by an eye care professional within the past year Reports that she has not had a formal hearing evaluation within the past year  Cognitive Function: 6CIT Screen 12/13/2020  What Year? 0 points  What month? 0 points  What time? 0 points  Count back from 20 0 points  Months in reverse 0 points  Repeat phrase 0 points  Total Score 0    Normal Cognitive Function Screening: Yes (Normal:0-7, Significant for Dysfunction: >8)  Immunization & Health Maintenance Record Immunization History  Administered Date(s) Administered   Fluad Quad(high Dose 65+) 10/09/2018, 10/26/2020   Influenza, High Dose Seasonal PF 10/29/2016, 10/08/2017   Influenza, Seasonal, Injecte, Preservative Fre 11/11/2012, 11/11/2013   Influenza,trivalent, recombinat,  inj, PF 11/03/2011   Influenza-Unspecified 11/03/2014, 11/09/2015, 10/29/2016, 10/12/2019   Moderna Covid-19 Vaccine Bivalent Booster 31yrs & up 11/18/2020   Moderna Sars-Covid-2 Vaccination 02/09/2019, 03/10/2019, 10/31/2019   PNEUMOCOCCAL CONJUGATE-20 12/13/2020   Pneumococcal Polysaccharide-23 08/30/2011   Tdap 07/17/2010   Zoster Recombinat (Shingrix) 07/23/2017, 01/13/2018   Zoster, Live 09/16/2007    Health Maintenance  Topic Date Due   TETANUS/TDAP  12/13/2021 (Originally 07/16/2020)   Hepatitis C Screening  12/13/2021 (Originally 12/08/1963)   MAMMOGRAM  09/15/2022   COLONOSCOPY (Pts 45-28yrs Insurance coverage will need to be confirmed)  09/30/2024   Pneumonia Vaccine 14+ Years old  Completed   INFLUENZA VACCINE  Completed   DEXA SCAN  Completed   COVID-19 Vaccine  Completed   Zoster Vaccines- Shingrix  Completed   HPV VACCINES  Aged Out       Assessment  This is a routine wellness examination for [employer].  Health Maintenance: Due or Overdue There are no preventive care reminders to display for this patient.   Ladell Pier Un does not need a referral for Community Assistance: Care Management:   no Social Work:    no Prescription Assistance:  no Nutrition/Diabetes Education:  no   Plan:  Personalized Goals  Goals Addressed               This Visit's Progress     Patient Stated (pt-stated)        12/13/2020 AWV Goal: Exercise for General Health  Patient will verbalize understanding of the benefits of increased physical activity: Exercising regularly is important. It will improve your overall fitness, flexibility, and endurance. Regular exercise also will improve your overall health. It can help you control your weight, reduce stress, and improve your bone density. Over the next year, patient will increase physical activity as tolerated with a goal of at least 150 minutes of moderate physical activity per week.  You can tell that you are exercising  at a moderate intensity if your heart starts beating faster and you start breathing faster but can still hold a conversation. Moderate-intensity exercise ideas include: Walking 1 mile (  1.6 km) in about 15 minutes Caro Dancing Water aerobics Patient will verbalize understanding of everyday activities that increase physical activity by providing examples like the following: Yard work, such as: Sales promotion account executive Gardening Washing windows or floors Patient will be able to explain general safety guidelines for exercising:  Before you start a new exercise program, talk with your health care provider. Do not exercise so much that you hurt yourself, feel dizzy, or get very short of breath. Wear comfortable clothes and wear shoes with good support. Drink plenty of water while you exercise to prevent dehydration or heat stroke. Work out until your breathing and your heartbeat get faster.        Personalized Health Maintenance & Screening Recommendations  Pneumococcal vaccine  Td vaccine  Lung Cancer Screening Recommended: no (Low Dose CT Chest recommended if Age 57-80 years, 30 pack-year currently smoking OR have quit w/in past 15 years) Hepatitis C Screening recommended: yes HIV Screening recommended: no  Advanced Directives: Written information was given per the patient's request.  Referrals & Orders Orders Placed This Encounter  Procedures   Pneumococcal conjugate vaccine 20-valent (Prevnar 20)     Follow-up Plan Follow-up with No primary care provider on file. as planned Schedule your tetanus shot at your pharmacy.  Medicare wellness visit in one year.  AVS printed and given to the patient.   I have personally reviewed and noted the following in the patient's chart:   Medical and social history Use of alcohol, tobacco or illicit drugs  Current medications and  supplements Functional ability and status Nutritional status Physical activity Advanced directives List of other physicians Hospitalizations, surgeries, and ER visits in previous 12 months Vitals Screenings to include cognitive, depression, and falls Referrals and appointments  In addition, I have reviewed and discussed with patient certain preventive protocols, quality metrics, and best practice recommendations. A written personalized care plan for preventive services as well as general preventive health recommendations were provided to patient.     Tinnie Gens, RN  12/13/2020

## 2020-12-14 ENCOUNTER — Other Ambulatory Visit: Payer: Self-pay | Admitting: Osteopathic Medicine

## 2020-12-14 DIAGNOSIS — R058 Other specified cough: Secondary | ICD-10-CM

## 2020-12-27 ENCOUNTER — Other Ambulatory Visit: Payer: Self-pay | Admitting: Medical-Surgical

## 2020-12-27 DIAGNOSIS — R058 Other specified cough: Secondary | ICD-10-CM

## 2020-12-28 ENCOUNTER — Ambulatory Visit (INDEPENDENT_AMBULATORY_CARE_PROVIDER_SITE_OTHER): Payer: Medicare HMO | Admitting: Medical-Surgical

## 2020-12-28 ENCOUNTER — Encounter: Payer: Self-pay | Admitting: Medical-Surgical

## 2020-12-28 ENCOUNTER — Other Ambulatory Visit: Payer: Self-pay

## 2020-12-28 VITALS — BP 129/83 | HR 74 | Resp 20 | Ht 65.0 in | Wt 151.0 lb

## 2020-12-28 DIAGNOSIS — I10 Essential (primary) hypertension: Secondary | ICD-10-CM

## 2020-12-28 DIAGNOSIS — K219 Gastro-esophageal reflux disease without esophagitis: Secondary | ICD-10-CM | POA: Diagnosis not present

## 2020-12-28 DIAGNOSIS — R058 Other specified cough: Secondary | ICD-10-CM | POA: Diagnosis not present

## 2020-12-28 DIAGNOSIS — Z7689 Persons encountering health services in other specified circumstances: Secondary | ICD-10-CM

## 2020-12-28 DIAGNOSIS — M25562 Pain in left knee: Secondary | ICD-10-CM | POA: Diagnosis not present

## 2020-12-28 DIAGNOSIS — E7800 Pure hypercholesterolemia, unspecified: Secondary | ICD-10-CM

## 2020-12-28 DIAGNOSIS — J45991 Cough variant asthma: Secondary | ICD-10-CM | POA: Diagnosis not present

## 2020-12-28 MED ORDER — FAMOTIDINE 20 MG PO TABS: 20.0000 mg | ORAL_TABLET | Freq: Every day | ORAL | 1 refills | Status: AC

## 2020-12-28 MED ORDER — BUDESONIDE-FORMOTEROL FUMARATE 80-4.5 MCG/ACT IN AERO: INHALATION_SPRAY | RESPIRATORY_TRACT | 12 refills | Status: DC

## 2020-12-28 MED ORDER — HYDROCHLOROTHIAZIDE 12.5 MG PO TABS: 12.5000 mg | ORAL_TABLET | Freq: Every day | ORAL | 1 refills | Status: DC

## 2020-12-28 MED ORDER — SIMVASTATIN 40 MG PO TABS: 40.0000 mg | ORAL_TABLET | Freq: Every day | ORAL | 1 refills | Status: DC

## 2020-12-28 MED ORDER — BENZONATATE 200 MG PO CAPS: 200.0000 mg | ORAL_CAPSULE | Freq: Two times a day (BID) | ORAL | 0 refills | Status: DC | PRN

## 2020-12-28 MED ORDER — ALBUTEROL SULFATE HFA 108 (90 BASE) MCG/ACT IN AERS: INHALATION_SPRAY | RESPIRATORY_TRACT | 0 refills | Status: DC

## 2020-12-28 MED ORDER — OMEPRAZOLE 20 MG PO CPDR: 20.0000 mg | DELAYED_RELEASE_CAPSULE | Freq: Every day | ORAL | 3 refills | Status: DC

## 2020-12-28 MED ORDER — LIDOCAINE 5 % EX PTCH: 1.0000 | MEDICATED_PATCH | Freq: Two times a day (BID) | CUTANEOUS | 2 refills | Status: AC

## 2020-12-28 NOTE — Progress Notes (Signed)
°  HPI with pertinent ROS:   CC: Transfer of care  HPI: Sheryl Freeman 75 year old female presenting to transfer care to a new PCP.  She is also following up on chronic diseases as below:  Hypertension-taking hydrochlorothiazide 12.5 mg daily, tolerating well without side effects. Denies CP, SOB, palpitations, lower extremity edema, dizziness, headaches, or vision changes.  Asthma-using Symbicort as prescribed with good relief of symptoms.  Does have albuterol on hand for emergency use but uses this very infrequently.  Uses Chlor-Trimeton 4 mg nightly as instructed by asthma allergy partners.  Taking famotidine 20 mg nightly with Prilosec every morning to help manage GERD symptoms as her asthma provider follow-up that LPR was contributing to her cough.  Notes that at night when her cough gets really bad, she does benefit from using [employer]  Uses these very infrequently and only when needed but would like to have a refill of this so she can have it on hand.  HLD-taking simvastatin 40 mg daily as prescribed, tolerating well.  Working to follow a low-fat diet.  Left knee pain-has been experiencing some mild swelling to the left knee along with some discomfort intermittently.  She uses lidocaine patches topically as well as a knee brace as needed.  When it gets bad, she has taken some of her husband's Celebrex which resolves the pain quickly.  She would like to have her own prescription for Celebrex.  No previous renal function issues and negative for GI bleed/ulcers.  I reviewed the past medical history, family history, social history, surgical history, and allergies today and no changes were needed.  Please see the problem list section below in epic for further details.   Physical exam:   General: Well Developed, well nourished, and in no acute distress.  Neuro: Alert and oriented x3.  HEENT: Normocephalic, atraumatic.  Skin: Warm and dry. Cardiac: Regular rate and rhythm, no murmurs rubs or  gallops, no lower extremity edema.  Respiratory: Clear to auscultation bilaterally. Not using accessory muscles, speaking in full sentences.  Impression and Recommendations:    1. Encounter to establish care Reviewed available information and discussed care concerns with patient.   2. Essential hypertension Blood pressure well controlled today on hydrochlorothiazide 12.5 mg daily.  Continue to monitor blood pressure at home with a goal of 130/80 or less.  3. Cough variant asthma Continue Symbicort and albuterol as prescribed.  Managed by asthma/allergy partners.  4. Gastroesophageal reflux disease, unspecified whether esophagitis present Continue famotidine and Prilosec as prescribed.  5. Hypercholesteremia Continue simvastatin.  6. Acute pain of left knee Continue lidocaine patch topically for 12 hours out of every day as needed.  Sending in Celebrex 200 mg twice daily as needed.  If this is not helpful or if her knee pain does not resolve in the next 4 to 6 weeks, we may need to consider joint injection.  Return in about 6 months (around 06/28/2021) for chronic disease follow up. ___________________________________________ Clearnce Sorrel, DNP, APRN, FNP-BC Primary Care and [place]

## 2020-12-30 ENCOUNTER — Telehealth: Payer: Self-pay

## 2020-12-30 NOTE — Telephone Encounter (Signed)
Medication: lidocaine (LIDODERM) 5 % Prior authorization determination received Medication has been approved Approval dates: 01/09/2020-01/07/2022  Patient aware via: [place] aware: Yes Provider aware via this encounter

## 2020-12-30 NOTE — Telephone Encounter (Signed)
Medication: lidocaine (LIDODERM) 5 % Prior authorization submitted via CoverMyMeds on 12/30/2020 PA submission pending

## 2021-01-17 ENCOUNTER — Other Ambulatory Visit: Payer: Self-pay | Admitting: Medical-Surgical

## 2021-01-17 DIAGNOSIS — R058 Other specified cough: Secondary | ICD-10-CM

## 2021-01-26 ENCOUNTER — Ambulatory Visit: Payer: Medicare HMO | Admitting: Internal Medicine

## 2021-01-30 ENCOUNTER — Ambulatory Visit: Payer: Medicare HMO | Admitting: Internal Medicine

## 2021-01-31 ENCOUNTER — Other Ambulatory Visit: Payer: Self-pay | Admitting: Medical-Surgical

## 2021-01-31 DIAGNOSIS — R058 Other specified cough: Secondary | ICD-10-CM

## 2021-02-16 ENCOUNTER — Ambulatory Visit: Payer: Medicare HMO | Admitting: Internal Medicine

## 2021-06-28 ENCOUNTER — Ambulatory Visit: Payer: Medicare HMO | Admitting: Medical-Surgical

## 2021-06-28 ENCOUNTER — Telehealth: Payer: Self-pay | Admitting: Medical-Surgical

## 2021-06-28 NOTE — Telephone Encounter (Signed)
Pt called. Pt believes she is due for labs?

## 2021-08-03 ENCOUNTER — Ambulatory Visit: Payer: Medicare HMO | Admitting: Medical-Surgical

## 2021-08-07 DIAGNOSIS — J45901 Unspecified asthma with (acute) exacerbation: Secondary | ICD-10-CM | POA: Diagnosis not present

## 2021-08-07 DIAGNOSIS — M791 Myalgia, unspecified site: Secondary | ICD-10-CM | POA: Diagnosis not present

## 2021-08-07 DIAGNOSIS — W57XXXA Bitten or stung by nonvenomous insect and other nonvenomous arthropods, initial encounter: Secondary | ICD-10-CM | POA: Diagnosis not present

## 2021-08-14 ENCOUNTER — Other Ambulatory Visit: Payer: Self-pay | Admitting: Medical-Surgical

## 2021-08-14 DIAGNOSIS — Z1231 Encounter for screening mammogram for malignant neoplasm of breast: Secondary | ICD-10-CM

## 2021-08-25 DIAGNOSIS — N390 Urinary tract infection, site not specified: Secondary | ICD-10-CM | POA: Diagnosis not present

## 2021-09-12 ENCOUNTER — Telehealth: Payer: Self-pay | Admitting: Medical-Surgical

## 2021-09-12 MED ORDER — HYDROCHLOROTHIAZIDE 12.5 MG PO TABS: 12.5000 mg | ORAL_TABLET | Freq: Every day | ORAL | 0 refills | Status: DC

## 2021-09-12 MED ORDER — SIMVASTATIN 40 MG PO TABS: 40.0000 mg | ORAL_TABLET | Freq: Every day | ORAL | 0 refills | Status: DC

## 2021-09-12 NOTE — Telephone Encounter (Signed)
Sent 30 day supply to the pharmacy no refills.

## 2021-09-12 NOTE — Telephone Encounter (Signed)
Patient called and rescheduled appt states she needs refills on Simvastatin and Hydrochlorothiazide sent to pharmacy

## 2021-09-18 ENCOUNTER — Other Ambulatory Visit: Payer: Self-pay

## 2021-09-18 DIAGNOSIS — R058 Other specified cough: Secondary | ICD-10-CM

## 2021-09-18 MED ORDER — ALBUTEROL SULFATE HFA 108 (90 BASE) MCG/ACT IN AERS: INHALATION_SPRAY | RESPIRATORY_TRACT | 0 refills | Status: AC

## 2021-09-18 NOTE — Progress Notes (Signed)
Refill sent.

## 2021-09-18 NOTE — Progress Notes (Signed)
Patient has an upcoming appointment 10/06/2021  Last office visit 12/28/2020

## 2021-09-21 ENCOUNTER — Ambulatory Visit (INDEPENDENT_AMBULATORY_CARE_PROVIDER_SITE_OTHER): Payer: Medicare HMO

## 2021-09-21 DIAGNOSIS — Z1231 Encounter for screening mammogram for malignant neoplasm of breast: Secondary | ICD-10-CM | POA: Diagnosis not present

## 2021-10-06 ENCOUNTER — Ambulatory Visit: Payer: Medicare HMO | Admitting: Medical-Surgical

## 2021-10-10 ENCOUNTER — Ambulatory Visit: Payer: Medicare HMO | Admitting: Medical-Surgical

## 2021-10-17 ENCOUNTER — Other Ambulatory Visit: Payer: Self-pay

## 2021-10-17 MED ORDER — SIMVASTATIN 40 MG PO TABS: 40.0000 mg | ORAL_TABLET | Freq: Every day | ORAL | 0 refills | Status: DC

## 2021-11-15 ENCOUNTER — Other Ambulatory Visit: Payer: Self-pay | Admitting: Medical-Surgical

## 2021-11-15 NOTE — Telephone Encounter (Signed)
Last office visit 12/28/2020  Canceled 06/28/2021, 08/03/2021, 10/06/2021, 10/10/2021  Future appointment 11/20/2021  Last refilled 10/17/2021  Sent 30 day supply to pharmacy. No refills.

## 2021-11-19 NOTE — Progress Notes (Unsigned)
   Established Patient Office Visit  Subjective   Patient ID: Sheryl Freeman, female   DOB: [birth year] Age: 76 y.o. MRN: [id number]   No chief complaint on file.   HPI    Objective:    There were no vitals filed for this visit.  Physical Exam   No results found for this or any previous visit (from the past 24 hour(s)).   {Labs (Optional):23779}  The 10-year ASCVD risk score (Arnett DK, et al., 2019) is: 19.3%   Values used to calculate the score:     Age: 37 years     Sex: Female     Is Non-Hispanic African American: No     Diabetic: No     Tobacco smoker: No     Systolic Blood Pressure: 211 mmHg     Is BP treated: Yes     HDL Cholesterol: 57 mg/dL     Total Cholesterol: 198 mg/dL   Assessment & Plan:   No problem-specific Assessment & Plan notes found for this encounter.   No follow-ups on file.  ___________________________________________ Clearnce Sorrel, DNP, APRN, FNP-BC Primary Care and [place]

## 2021-11-20 ENCOUNTER — Encounter: Payer: Self-pay | Admitting: Medical-Surgical

## 2021-11-20 ENCOUNTER — Ambulatory Visit (INDEPENDENT_AMBULATORY_CARE_PROVIDER_SITE_OTHER): Payer: Medicare HMO | Admitting: Medical-Surgical

## 2021-11-20 VITALS — BP 117/74 | HR 64 | Resp 20 | Ht 65.0 in | Wt 140.1 lb

## 2021-11-20 DIAGNOSIS — E559 Vitamin D deficiency, unspecified: Secondary | ICD-10-CM | POA: Diagnosis not present

## 2021-11-20 DIAGNOSIS — M8580 Other specified disorders of bone density and structure, unspecified site: Secondary | ICD-10-CM

## 2021-11-20 DIAGNOSIS — I10 Essential (primary) hypertension: Secondary | ICD-10-CM | POA: Diagnosis not present

## 2021-11-20 DIAGNOSIS — E7800 Pure hypercholesterolemia, unspecified: Secondary | ICD-10-CM

## 2021-11-20 DIAGNOSIS — J45909 Unspecified asthma, uncomplicated: Secondary | ICD-10-CM

## 2021-11-20 DIAGNOSIS — R7303 Prediabetes: Secondary | ICD-10-CM | POA: Diagnosis not present

## 2021-11-20 MED ORDER — BENZONATATE 200 MG PO CAPS: 200.0000 mg | ORAL_CAPSULE | Freq: Three times a day (TID) | ORAL | 3 refills | Status: DC | PRN

## 2021-11-20 MED ORDER — HYDROCHLOROTHIAZIDE 12.5 MG PO TABS: 12.5000 mg | ORAL_TABLET | Freq: Every day | ORAL | 1 refills | Status: DC

## 2021-11-20 MED ORDER — SIMVASTATIN 40 MG PO TABS: 40.0000 mg | ORAL_TABLET | Freq: Every day | ORAL | 1 refills | Status: DC

## 2021-11-21 DIAGNOSIS — E559 Vitamin D deficiency, unspecified: Secondary | ICD-10-CM | POA: Diagnosis not present

## 2021-11-21 DIAGNOSIS — R7303 Prediabetes: Secondary | ICD-10-CM | POA: Diagnosis not present

## 2021-11-21 DIAGNOSIS — I10 Essential (primary) hypertension: Secondary | ICD-10-CM | POA: Diagnosis not present

## 2021-11-22 LAB — COMPLETE METABOLIC PANEL WITH GFR
AG Ratio: 1.9 (calc) (ref 1.0–2.5)
ALT: 13 U/L (ref 6–29)
AST: 19 U/L (ref 10–35)
Albumin: 4.6 g/dL (ref 3.6–5.1)
Alkaline phosphatase (APISO): 59 U/L (ref 37–153)
BUN: 21 mg/dL (ref 7–25)
CO2: 27 mmol/L (ref 20–32)
Calcium: 9.9 mg/dL (ref 8.6–10.4)
Chloride: 103 mmol/L (ref 98–110)
Creat: 0.94 mg/dL (ref 0.60–1.00)
Globulin: 2.4 g/dL (calc) (ref 1.9–3.7)
Glucose, Bld: 96 mg/dL (ref 65–99)
Potassium: 4.5 mmol/L (ref 3.5–5.3)
Sodium: 140 mmol/L (ref 135–146)
Total Bilirubin: 1.8 mg/dL — ABNORMAL HIGH (ref 0.2–1.2)
Total Protein: 7 g/dL (ref 6.1–8.1)
eGFR: 64 mL/min/{1.73_m2} (ref >=60)

## 2021-11-22 LAB — CBC WITH DIFFERENTIAL/PLATELET
Absolute Monocytes: 384 cells/uL (ref 200–950)
Basophils Absolute: 49 cells/uL (ref 0–200)
Basophils Relative: 0.8 %
Eosinophils Absolute: 122 cells/uL (ref 15–500)
Eosinophils Relative: 2 %
HCT: 41.5 % (ref 35.0–45.0)
Hemoglobin: 14.2 g/dL (ref 11.7–15.5)
Lymphs Abs: 2050 cells/uL (ref 850–3900)
MCH: 31.9 pg (ref 27.0–33.0)
MCHC: 34.2 g/dL (ref 32.0–36.0)
MCV: 93.3 fL (ref 80.0–100.0)
MPV: 11.8 fL (ref 7.5–12.5)
Monocytes Relative: 6.3 %
Neutro Abs: 3495 cells/uL (ref 1500–7800)
Neutrophils Relative %: 57.3 %
Platelets: 229 10*3/uL (ref 140–400)
RBC: 4.45 10*6/uL (ref 3.80–5.10)
RDW: 12.7 % (ref 11.0–15.0)
Total Lymphocyte: 33.6 %
WBC: 6.1 10*3/uL (ref 3.8–10.8)

## 2021-11-22 LAB — HEMOGLOBIN A1C
Hgb A1c MFr Bld: 5.8 % of total Hgb — ABNORMAL HIGH (ref <5.7)
Mean Plasma Glucose: 120 mg/dL
eAG (mmol/L): 6.6 mmol/L

## 2021-11-22 LAB — LIPID PANEL
Cholesterol: 227 mg/dL — ABNORMAL HIGH (ref <200)
HDL: 63 mg/dL (ref >=50)
LDL Cholesterol (Calc): 122 mg/dL (calc) — ABNORMAL HIGH
Non-HDL Cholesterol (Calc): 164 mg/dL (calc) — ABNORMAL HIGH (ref <130)
Total CHOL/HDL Ratio: 3.6 (calc) (ref <5.0)
Triglycerides: 270 mg/dL — ABNORMAL HIGH (ref <150)

## 2021-11-22 LAB — VITAMIN D 25 HYDROXY (VIT D DEFICIENCY, FRACTURES): Vit D, 25-Hydroxy: 25 ng/mL — ABNORMAL LOW (ref 30–100)

## 2021-12-18 ENCOUNTER — Other Ambulatory Visit: Payer: Self-pay | Admitting: Medical-Surgical

## 2021-12-18 DIAGNOSIS — E7800 Pure hypercholesterolemia, unspecified: Secondary | ICD-10-CM

## 2022-01-09 ENCOUNTER — Telehealth: Payer: Self-pay

## 2022-01-09 NOTE — Telephone Encounter (Signed)
Please contact patient to schedule an appointment with Samuel Bouche.  Patient found a place on her ear and her husband thinks that it could be skin cancer. Patient is wanting Samuel Bouche to send a referral to Dermatology but Caryl Asp needs to see her first.

## 2022-02-19 ENCOUNTER — Ambulatory Visit: Payer: Medicare HMO | Admitting: Medical-Surgical

## 2022-03-14 ENCOUNTER — Other Ambulatory Visit: Payer: Self-pay | Admitting: Medical-Surgical

## 2022-04-23 DIAGNOSIS — L814 Other melanin hyperpigmentation: Secondary | ICD-10-CM | POA: Diagnosis not present

## 2022-04-23 DIAGNOSIS — L7211 Pilar cyst: Secondary | ICD-10-CM | POA: Diagnosis not present

## 2022-04-23 DIAGNOSIS — D2239 Melanocytic nevi of other parts of face: Secondary | ICD-10-CM | POA: Diagnosis not present

## 2022-04-23 DIAGNOSIS — L570 Actinic keratosis: Secondary | ICD-10-CM | POA: Diagnosis not present

## 2022-04-23 DIAGNOSIS — L821 Other seborrheic keratosis: Secondary | ICD-10-CM | POA: Diagnosis not present

## 2022-05-21 ENCOUNTER — Ambulatory Visit: Payer: Medicare HMO | Admitting: Medical-Surgical

## 2022-05-21 ENCOUNTER — Ambulatory Visit (INDEPENDENT_AMBULATORY_CARE_PROVIDER_SITE_OTHER): Payer: Medicare HMO | Admitting: Medical-Surgical

## 2022-05-21 ENCOUNTER — Encounter: Payer: Self-pay | Admitting: Medical-Surgical

## 2022-05-21 VITALS — BP 150/74 | HR 84 | Resp 20 | Ht 65.0 in | Wt 139.0 lb

## 2022-05-21 DIAGNOSIS — R7303 Prediabetes: Secondary | ICD-10-CM

## 2022-05-21 DIAGNOSIS — I10 Essential (primary) hypertension: Secondary | ICD-10-CM

## 2022-05-21 DIAGNOSIS — E7800 Pure hypercholesterolemia, unspecified: Secondary | ICD-10-CM

## 2022-05-21 DIAGNOSIS — E559 Vitamin D deficiency, unspecified: Secondary | ICD-10-CM

## 2022-05-21 MED ORDER — HYDROCHLOROTHIAZIDE 12.5 MG PO TABS: 12.5000 mg | ORAL_TABLET | Freq: Every day | ORAL | 1 refills | Status: AC

## 2022-05-21 MED ORDER — MUPIROCIN CALCIUM 2 % EX CREA: 1.0000 | TOPICAL_CREAM | Freq: Two times a day (BID) | CUTANEOUS | 0 refills | Status: DC

## 2022-05-21 MED ORDER — SIMVASTATIN 40 MG PO TABS: 40.0000 mg | ORAL_TABLET | Freq: Every day | ORAL | 1 refills | Status: AC

## 2022-05-21 MED ORDER — ESTROGENS CONJUGATED 0.625 MG/GM VA CREA: 1.0000 | TOPICAL_CREAM | Freq: Every day | VAGINAL | 0 refills | Status: DC

## 2022-05-21 NOTE — Progress Notes (Signed)
        Established patient visit  History, exam, impression, and plan:  1. Essential hypertension Pleasant 77 year old female with a hx of HTN currently treated with HCTZ 12.5mg  daily. Tolerating well without side effects. Following a low sodium diet. Staying physically active. Denies CP, SOB, LE edema, HA, palpitations, and dizziness. HRR, S1/S2 normal. No edema. Lungs CTA. BP elevated on arrival, recheck not at goal. Continue HCTZ as prescribed. Recommend checking BP at home with a goal of 130/80 or less. Plan to reach out in 2 weeks to see how BP has been running. If not at goal, plan to increase HCTZ dose.  - hydrochlorothiazide (HYDRODIURIL) 12.5 MG tablet; Take 1 tablet (12.5 mg total) by mouth daily.  Dispense: 90 tablet; Refill: 1  2. Hypercholesteremia Taking Zocor 40mg  daily. Has made some dietary changes and is focusing on more plant based options. Lipids UTD. Continue Zocor.  - simvastatin (ZOCOR) 40 MG tablet; Take 1 tablet (40 mg total) by mouth daily at 6 PM.  Dispense: 90 tablet; Refill: 1  3. Prediabetes A1c 5.8% in November. Rechecking today.  - Hemoglobin A1c  4. Vitamin D deficiency Rechecking vitamin D.  - VITAMIN D 25 Hydroxy (Vit-D Deficiency, Fractures)  Procedures performed this visit: None.  Return in about 6 months (around 11/21/2022) for chronic disease follow up.  __________________________________ Thayer Ohm, DNP, APRN, FNP-BC Primary Care and Sports Medicine [facility] [place]

## 2022-05-22 LAB — HEMOGLOBIN A1C
Hgb A1c MFr Bld: 5.8 % of total Hgb — ABNORMAL HIGH (ref <5.7)
Mean Plasma Glucose: 120 mg/dL
eAG (mmol/L): 6.6 mmol/L

## 2022-05-22 LAB — VITAMIN D 25 HYDROXY (VIT D DEFICIENCY, FRACTURES): Vit D, 25-Hydroxy: 26 ng/mL — ABNORMAL LOW (ref 30–100)

## 2022-05-23 DIAGNOSIS — H25813 Combined forms of age-related cataract, bilateral: Secondary | ICD-10-CM | POA: Diagnosis not present

## 2022-05-23 DIAGNOSIS — H43813 Vitreous degeneration, bilateral: Secondary | ICD-10-CM | POA: Diagnosis not present

## 2022-05-23 DIAGNOSIS — H524 Presbyopia: Secondary | ICD-10-CM | POA: Diagnosis not present

## 2022-05-23 DIAGNOSIS — H04123 Dry eye syndrome of bilateral lacrimal glands: Secondary | ICD-10-CM | POA: Diagnosis not present

## 2022-05-23 DIAGNOSIS — Z135 Encounter for screening for eye and ear disorders: Secondary | ICD-10-CM | POA: Diagnosis not present

## 2022-05-23 DIAGNOSIS — H52223 Regular astigmatism, bilateral: Secondary | ICD-10-CM | POA: Diagnosis not present

## 2022-07-17 ENCOUNTER — Telehealth: Payer: Self-pay | Admitting: Medical-Surgical

## 2022-07-17 MED ORDER — BUDESONIDE-FORMOTEROL FUMARATE 80-4.5 MCG/ACT IN AERO: INHALATION_SPRAY | RESPIRATORY_TRACT | 0 refills | Status: DC

## 2022-07-17 NOTE — Telephone Encounter (Signed)
Refill sent.

## 2022-07-17 NOTE — Telephone Encounter (Signed)
Patient called she is requesting a refill on Symbicort 80-4.5 MCG/Act Inhaler Pharmacy is [facility] [phone]

## 2022-07-24 DIAGNOSIS — L7211 Pilar cyst: Secondary | ICD-10-CM | POA: Diagnosis not present

## 2022-07-24 DIAGNOSIS — D492 Neoplasm of unspecified behavior of bone, soft tissue, and skin: Secondary | ICD-10-CM | POA: Diagnosis not present

## 2022-09-11 ENCOUNTER — Other Ambulatory Visit: Payer: Self-pay | Admitting: Medical-Surgical

## 2022-11-21 ENCOUNTER — Ambulatory Visit: Payer: Medicare HMO | Admitting: Medical-Surgical

## 2023-01-26 ENCOUNTER — Other Ambulatory Visit: Payer: Self-pay

## 2023-01-26 ENCOUNTER — Ambulatory Visit
Admission: RE | Admit: 2023-01-26 | Discharge: 2023-01-26 | Disposition: A | Discharge status: Home / Self Care | Payer: Medicare HMO | Source: Ambulatory Visit | Attending: Family Medicine | Admitting: Family Medicine

## 2023-01-26 VITALS — BP 155/79 | HR 73 | Temp 97.5°F | Resp 16

## 2023-01-26 DIAGNOSIS — N390 Urinary tract infection, site not specified: Secondary | ICD-10-CM | POA: Diagnosis not present

## 2023-01-26 DIAGNOSIS — R300 Dysuria: Secondary | ICD-10-CM

## 2023-01-26 DIAGNOSIS — R051 Acute cough: Secondary | ICD-10-CM | POA: Diagnosis not present

## 2023-01-26 HISTORY — DX: Essential (primary) hypertension: I10

## 2023-01-26 LAB — POCT URINALYSIS DIP (MANUAL ENTRY)
Bilirubin, UA: NEGATIVE
Glucose, UA: NEGATIVE mg/dL
Nitrite, UA: POSITIVE — AB
Protein Ur, POC: 100 mg/dL — AB
Spec Grav, UA: >=1.03 — AB (ref 1.010–1.025)
Urobilinogen, UA: 0.2 U/dL
pH, UA: 5 (ref 5.0–8.0)

## 2023-01-26 MED ORDER — SULFAMETHOXAZOLE-TRIMETHOPRIM 800-160 MG PO TABS: 1.0000 | ORAL_TABLET | Freq: Two times a day (BID) | ORAL | 0 refills | Status: AC

## 2023-01-26 MED ORDER — BENZONATATE 200 MG PO CAPS: 200.0000 mg | ORAL_CAPSULE | Freq: Three times a day (TID) | ORAL | 0 refills | Status: DC | PRN

## 2023-01-26 NOTE — ED Provider Notes (Signed)
[facility]    CSN: [id number] Arrival date & time: 01/26/23  1222      History   Chief Complaint Chief Complaint  Patient presents with   Urinary Frequency    Painful when urinating through the night-suspect UTI - Entered by patient    HPI Sheryl Freeman is a 78 y.o. female.   HPI  Patient has a history of frequent urinary tract infections.  Has not had one for over 5 years.  Woke up this morning with dysuria frequency and a dark-colored urine.  She recognizes this is a UTI.  No abdominal pain.  No flank pain.  No fever or chills She also complains of a URI with cough.  She thinks the cough syrup may have induced the urinary infection.  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      Home Medications    Prior to Admission medications   Medication Sig Start Date End Date Taking? Authorizing Provider  benzonatate (TESSALON) 200 MG capsule Take 1 capsule (200 mg total) by mouth 3  (three) times daily as needed for cough. 01/26/23  Yes Eustace Moore, MD  sulfamethoxazole-trimethoprim (BACTRIM DS) 800-160 MG tablet Take 1 tablet by mouth 2 (two) times daily for 7 days. 01/26/23 02/02/23 Yes Eustace Moore, MD  albuterol (VENTOLIN HFA) 108 (90 Base) MCG/ACT inhaler INHALE 1 TO 2 PUFFS INTO THE LUNGS EVERY 4 HOURS AS NEEDED FOR WHEEZING OR SHORTNESS OF BREATH 09/18/21   Christen Butter, NP  budesonide-formoterol (SYMBICORT) 80-4.5 MCG/ACT inhaler INHALE 2 PUFFS BY MOUTH FIRST THING IN THE MORNING, THEN ANOTHER 2 PUFFS ABOUT 12 HOURS LATER 09/11/22   Christen Butter, NP  chlorpheniramine (CHLOR-TRIMETON) 4 MG tablet Take 4 mg by mouth 2 (two) times daily as needed for allergies.    [provider]  Coenzyme Q10 (COQ10) 50 MG CAPS Take 100 mg by mouth daily. 05/25/19   Sunnie Nielsen, DO  conjugated estrogens (PREMARIN) vaginal cream Place 1 Applicatorful vaginally daily. 05/21/22   Christen Butter, NP  famotidine (PEPCID) 20 MG tablet Take 1 tablet (20 mg total) by mouth daily. 12/28/20   Christen Butter, NP  hydrochlorothiazide (HYDRODIURIL) 12.5 MG tablet Take 1 tablet (12.5 mg total) by mouth daily. 05/21/22   Christen Butter, NP  lidocaine (LIDODERM) 5 % Place 1 patch onto the skin every 12 (twelve) hours. Remove & Discard patch within 12 hours or as directed by MD 12/28/20   Christen Butter, NP  simvastatin (  ZOCOR) 40 MG tablet Take 1 tablet (40 mg total) by mouth daily at 6 PM. 05/21/22   Christen Butter, NP  Spacer/Aero-Holding Deretha Emory (E-Z SPACER) inhaler Use as instructed 07/21/18   Sunnie Nielsen, DO    Family History Family History  Problem Relation Age of Onset   Alzheimer's disease Mother    Diabetes Father    Liver disease Father     Social History Social History   Tobacco Use   Smoking status: Never   Smokeless tobacco: Never  Vaping Use   Vaping status: Never Used  Substance Use Topics   Alcohol use: No   Drug use: No     Allergies   Codeine   Review of  Systems Review of Systems See HPI  Physical Exam Triage Vital Signs ED Triage Vitals  Encounter Vitals Group     BP 01/26/23 1230 (!) 155/79     Systolic BP Percentile --      Diastolic BP Percentile --      Pulse Rate 01/26/23 1230 73     Resp 01/26/23 1230 16     Temp 01/26/23 1230 (!) 97.5 F (36.4 C)     Temp src --      SpO2 01/26/23 1230 99 %     Weight --      Height --      Head Circumference --      Peak Flow --      Pain Score 01/26/23 1233 0     Pain Loc --      Pain Education --      Exclude from Growth Chart --    No data found.  Updated Vital Signs BP (!) 155/79   Pulse 73   Temp (!) 97.5 F (36.4 C)   Resp 16   SpO2 99%       Physical Exam Constitutional:      General: She is not in acute distress.    Appearance: She is well-developed and normal weight.  HENT:     Head: Normocephalic and atraumatic.  Eyes:     Conjunctiva/sclera: Conjunctivae normal.     Pupils: Pupils are equal, round, and reactive to light.  Cardiovascular:     Rate and Rhythm: Normal rate.  Pulmonary:     Effort: Pulmonary effort is normal. No respiratory distress.  Abdominal:     General: There is no distension.     Palpations: Abdomen is soft.     Tenderness: There is no right CVA tenderness or left CVA tenderness.  Musculoskeletal:        General: Normal range of motion.     Cervical back: Normal range of motion.  Skin:    General: Skin is warm and dry.  Neurological:     Mental Status: She is alert.      UC Treatments / Results  Labs (all labs ordered are listed, but only abnormal results are displayed) Labs Reviewed  POCT URINALYSIS DIP (MANUAL ENTRY) - Abnormal; Notable for the following components:      Result Value   Clarity, UA cloudy (*)    Ketones, POC UA trace (5) (*)    Spec Grav, UA >=1.030 (*)    Blood, UA large (*)    Protein Ur, POC =100 (*)    Nitrite, UA Positive (*)    Leukocytes, UA Small (1+) (*)    All other components within  normal limits  URINE CULTURE    EKG   Radiology No results  found.  Procedures Procedures (including critical care time)  Medications Ordered in UC Medications - No data to display  Initial Impression / Assessment and Plan / UC Course  I have reviewed the triage vital signs and the nursing notes.  Pertinent labs & imaging results that were available during my care of the patient were reviewed by me and considered in my medical decision making (see chart for details).     Final Clinical Impressions(s) / UC Diagnoses   Final diagnoses:  Dysuria  Recurrent urinary tract infection  Acute cough     Discharge Instructions      Drink lots of fluids Take the antibiotic 2 times a day May take Azo products for the pain  I have prescribed a coughing pill to help with your symptoms   ED Prescriptions     Medication Sig Dispense Auth. Provider   sulfamethoxazole-trimethoprim (BACTRIM DS) 800-160 MG tablet Take 1 tablet by mouth 2 (two) times daily for 7 days. 14 tablet Eustace Moore, MD   benzonatate (TESSALON) 200 MG capsule Take 1 capsule (200 mg total) by mouth 3 (three) times daily as needed for cough. 30 capsule Eustace Moore, MD      PDMP not reviewed this encounter.

## 2023-01-26 NOTE — Discharge Instructions (Addendum)
Drink lots of fluids Take the antibiotic 2 times a day May take Azo products for the pain  I have prescribed a coughing pill to help with your symptoms

## 2023-01-26 NOTE — ED Triage Notes (Signed)
Woke up this morning at 0200, noticed painful urination. No odor. Brown tinged. No fever. No otc meds.

## 2023-01-28 LAB — URINE CULTURE
Culture: 80000 — AB
Special Requests: NORMAL

## 2023-04-30 DIAGNOSIS — S30860A Insect bite (nonvenomous) of lower back and pelvis, initial encounter: Secondary | ICD-10-CM | POA: Diagnosis not present

## 2023-04-30 DIAGNOSIS — R590 Localized enlarged lymph nodes: Secondary | ICD-10-CM | POA: Diagnosis not present

## 2023-04-30 DIAGNOSIS — W57XXXA Bitten or stung by nonvenomous insect and other nonvenomous arthropods, initial encounter: Secondary | ICD-10-CM | POA: Diagnosis not present

## 2023-05-07 IMAGING — MG MM DIGITAL SCREENING BILAT W/ TOMO AND CAD
8 series · 8 of 24 positions shown · non-contrast
Comparison: Previous exam(s).

CLINICAL DATA: Screening.

EXAM:
DIGITAL SCREENING BILATERAL MAMMOGRAM WITH TOMOSYNTHESIS AND CAD
TECHNIQUE: Bilateral screening digital craniocaudal and mediolateral oblique
mammograms were obtained. Bilateral screening digital breast
tomosynthesis was performed. The images were evaluated with
computer-aided detection.

[R MLO synth-2D]
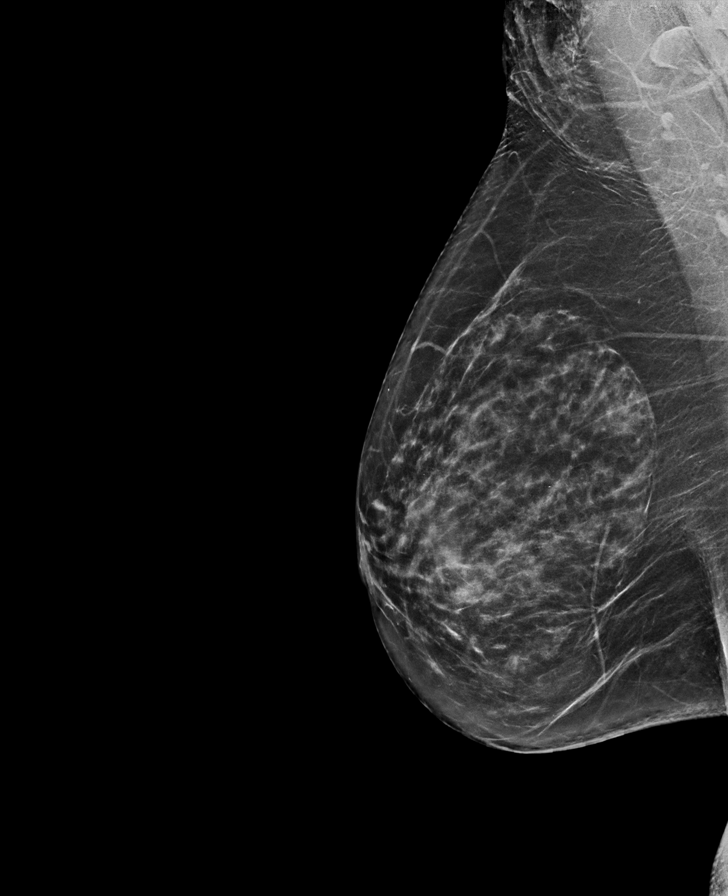

[R CC synth-2D]
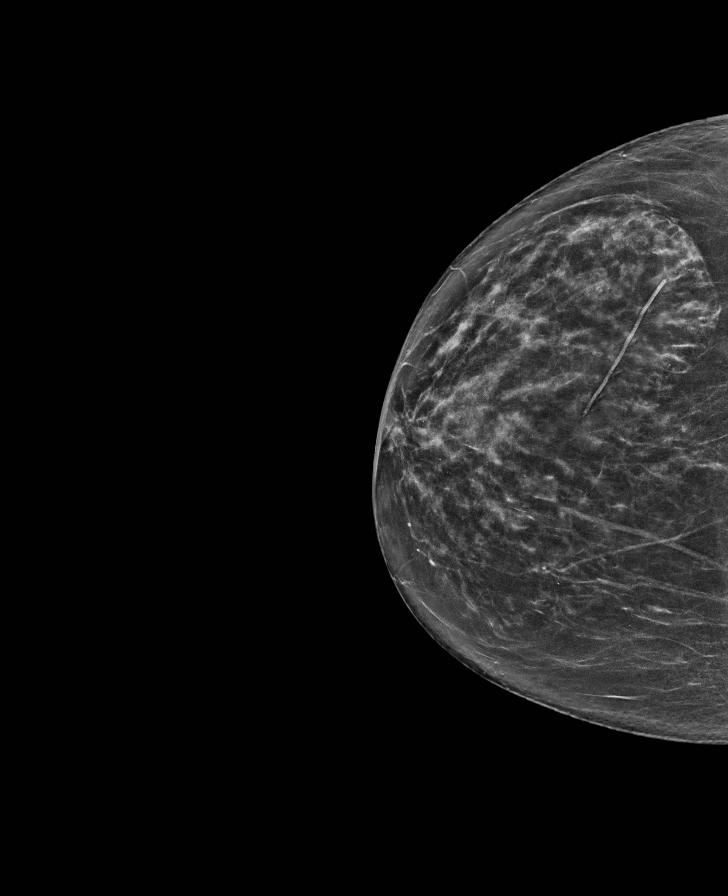

[L MLO synth-2D]
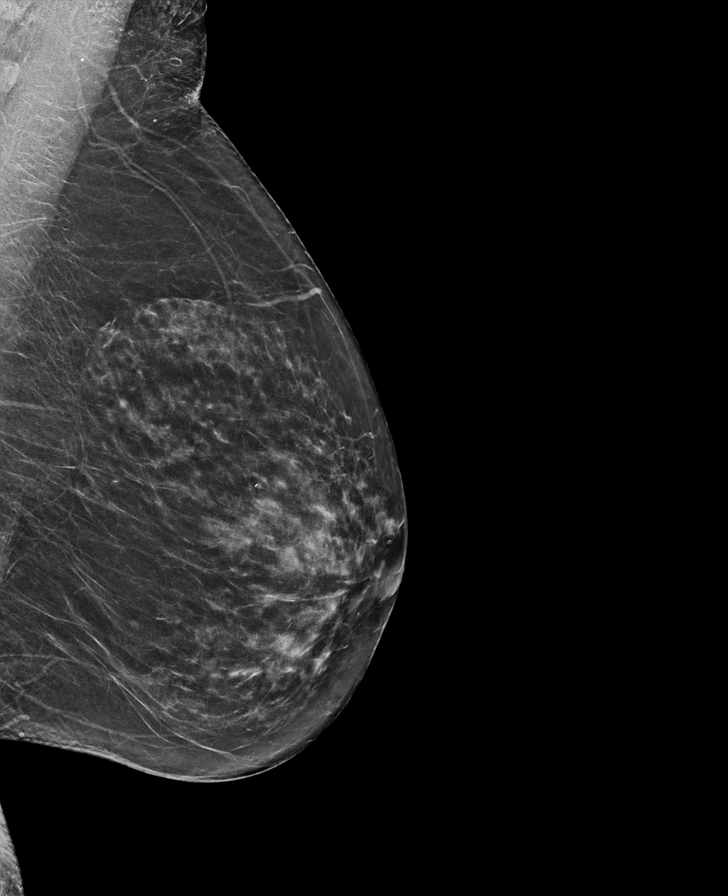

[L CC synth-2D]
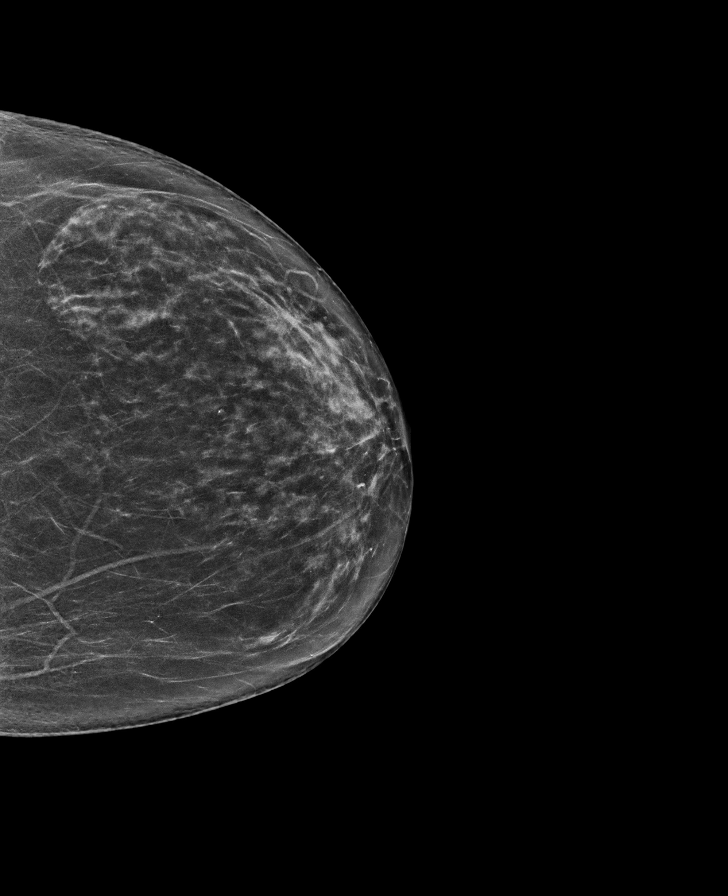

[L CC tomo · tomo slice 35/68.0]
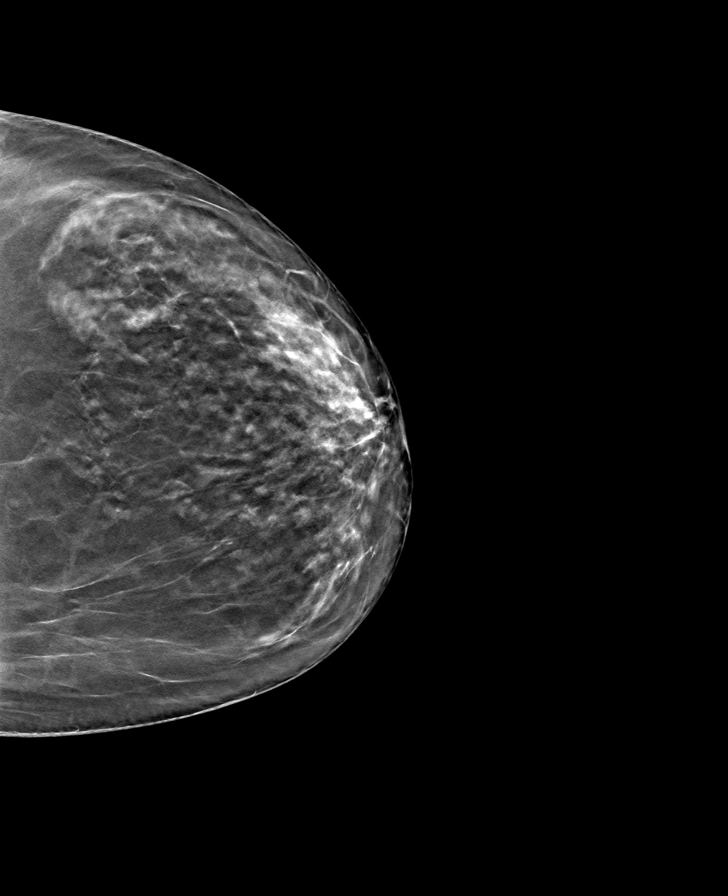

[R CC tomo · tomo slice 32/63.0]
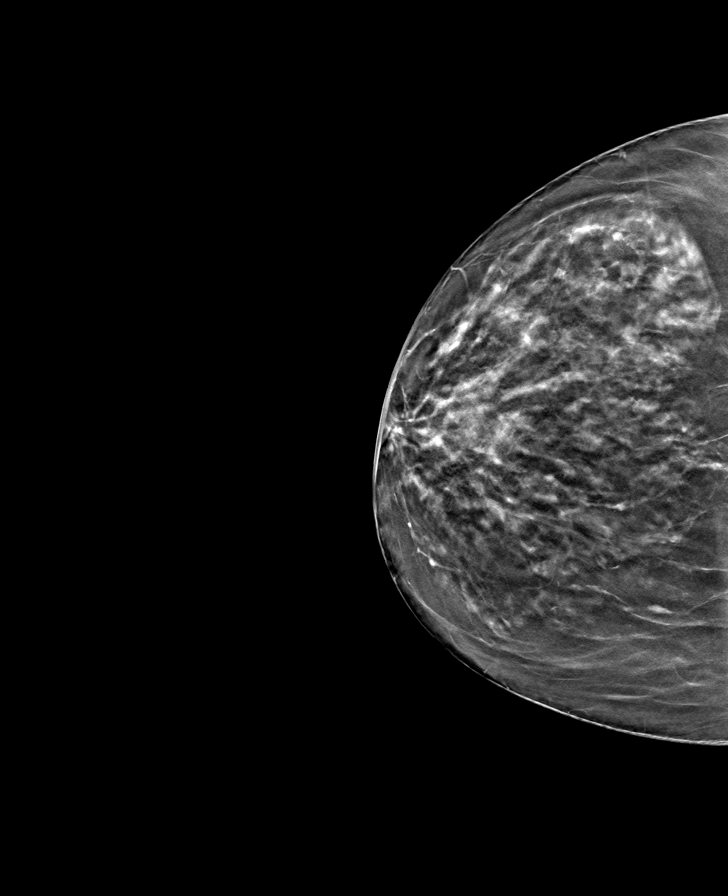

[L MLO tomo · tomo slice 33/65.0]
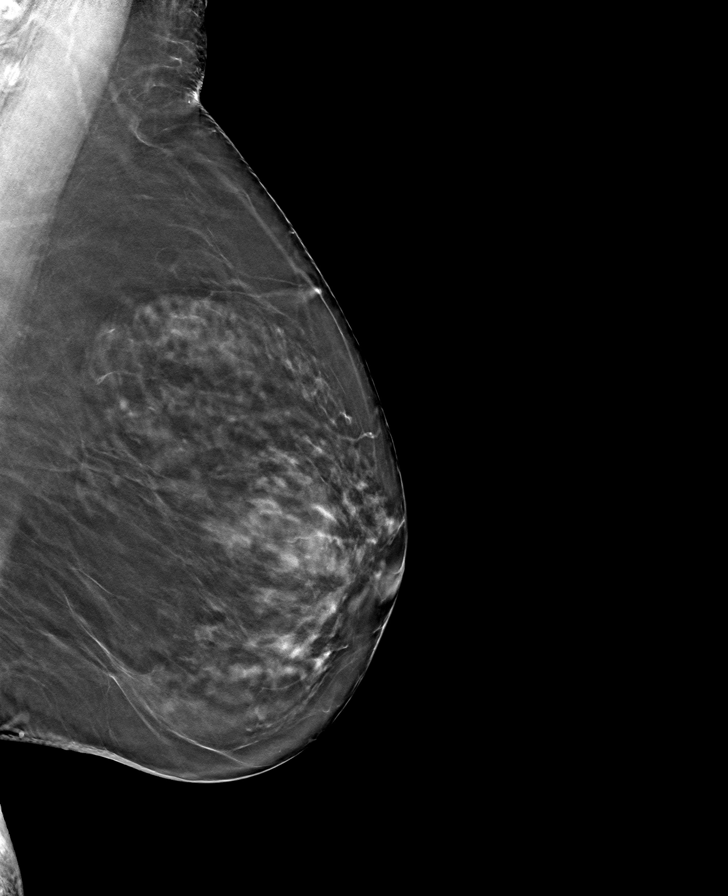

[R MLO tomo · tomo slice 35/70.0]
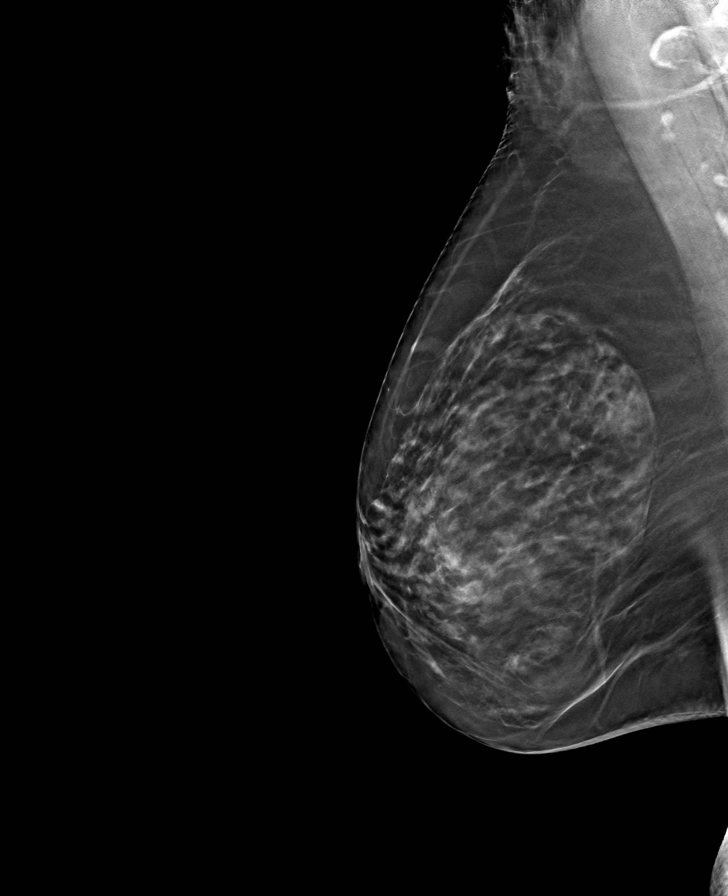

[8 of 24 positions shown; findings below may reference images not displayed]

ACR Breast Density Category b: There are scattered areas of
fibroglandular density.
FINDINGS: There are no findings suspicious for malignancy.
IMPRESSION: No mammographic evidence of malignancy. A result letter of this
screening mammogram will be mailed directly to the patient.

RECOMMENDATION:
Screening mammogram in one year. (Code:51-O-LD2)

BI-RADS CATEGORY  1: Negative.

## 2023-05-28 DIAGNOSIS — I10 Essential (primary) hypertension: Secondary | ICD-10-CM | POA: Diagnosis not present

## 2023-05-28 DIAGNOSIS — Z1330 Encounter for screening examination for mental health and behavioral disorders, unspecified: Secondary | ICD-10-CM | POA: Diagnosis not present

## 2023-05-28 DIAGNOSIS — R7309 Other abnormal glucose: Secondary | ICD-10-CM | POA: Diagnosis not present

## 2023-05-28 DIAGNOSIS — M8589 Other specified disorders of bone density and structure, multiple sites: Secondary | ICD-10-CM | POA: Diagnosis not present

## 2023-05-28 DIAGNOSIS — E782 Mixed hyperlipidemia: Secondary | ICD-10-CM | POA: Diagnosis not present

## 2023-05-28 DIAGNOSIS — J45991 Cough variant asthma: Secondary | ICD-10-CM | POA: Diagnosis not present

## 2023-05-28 DIAGNOSIS — Z1231 Encounter for screening mammogram for malignant neoplasm of breast: Secondary | ICD-10-CM | POA: Diagnosis not present

## 2023-06-07 IMAGING — DX DG CHEST 2V
2 series · 2 of 2 positions shown · non-contrast
Comparison: Chest radiograph 06/12/2016 and earlier.

CLINICAL DATA: 74-year-old female with cough. Left chest and rib
pain. Pleuritic pain.

EXAM:
CHEST - 2 VIEW

[chest pa]
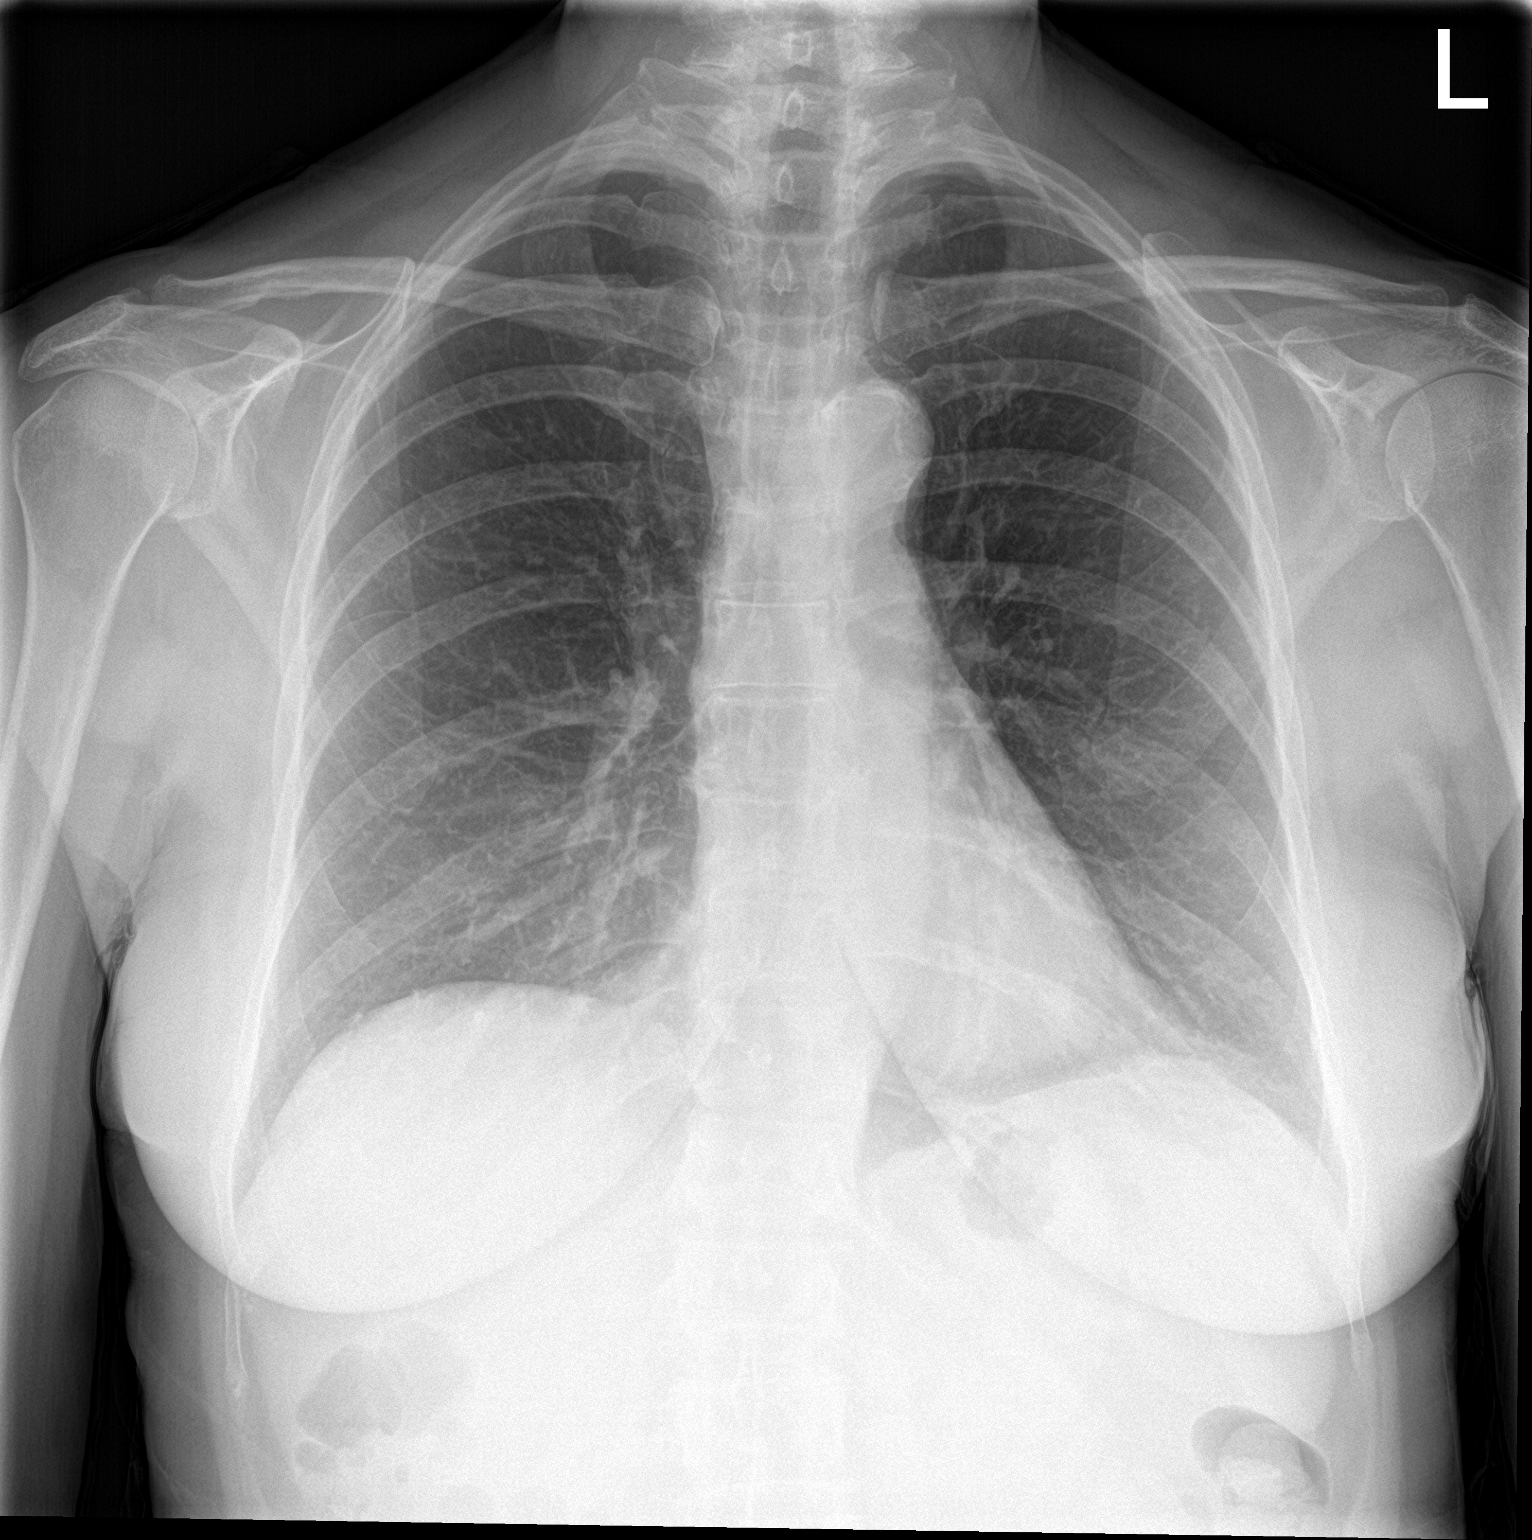

[chest lat]
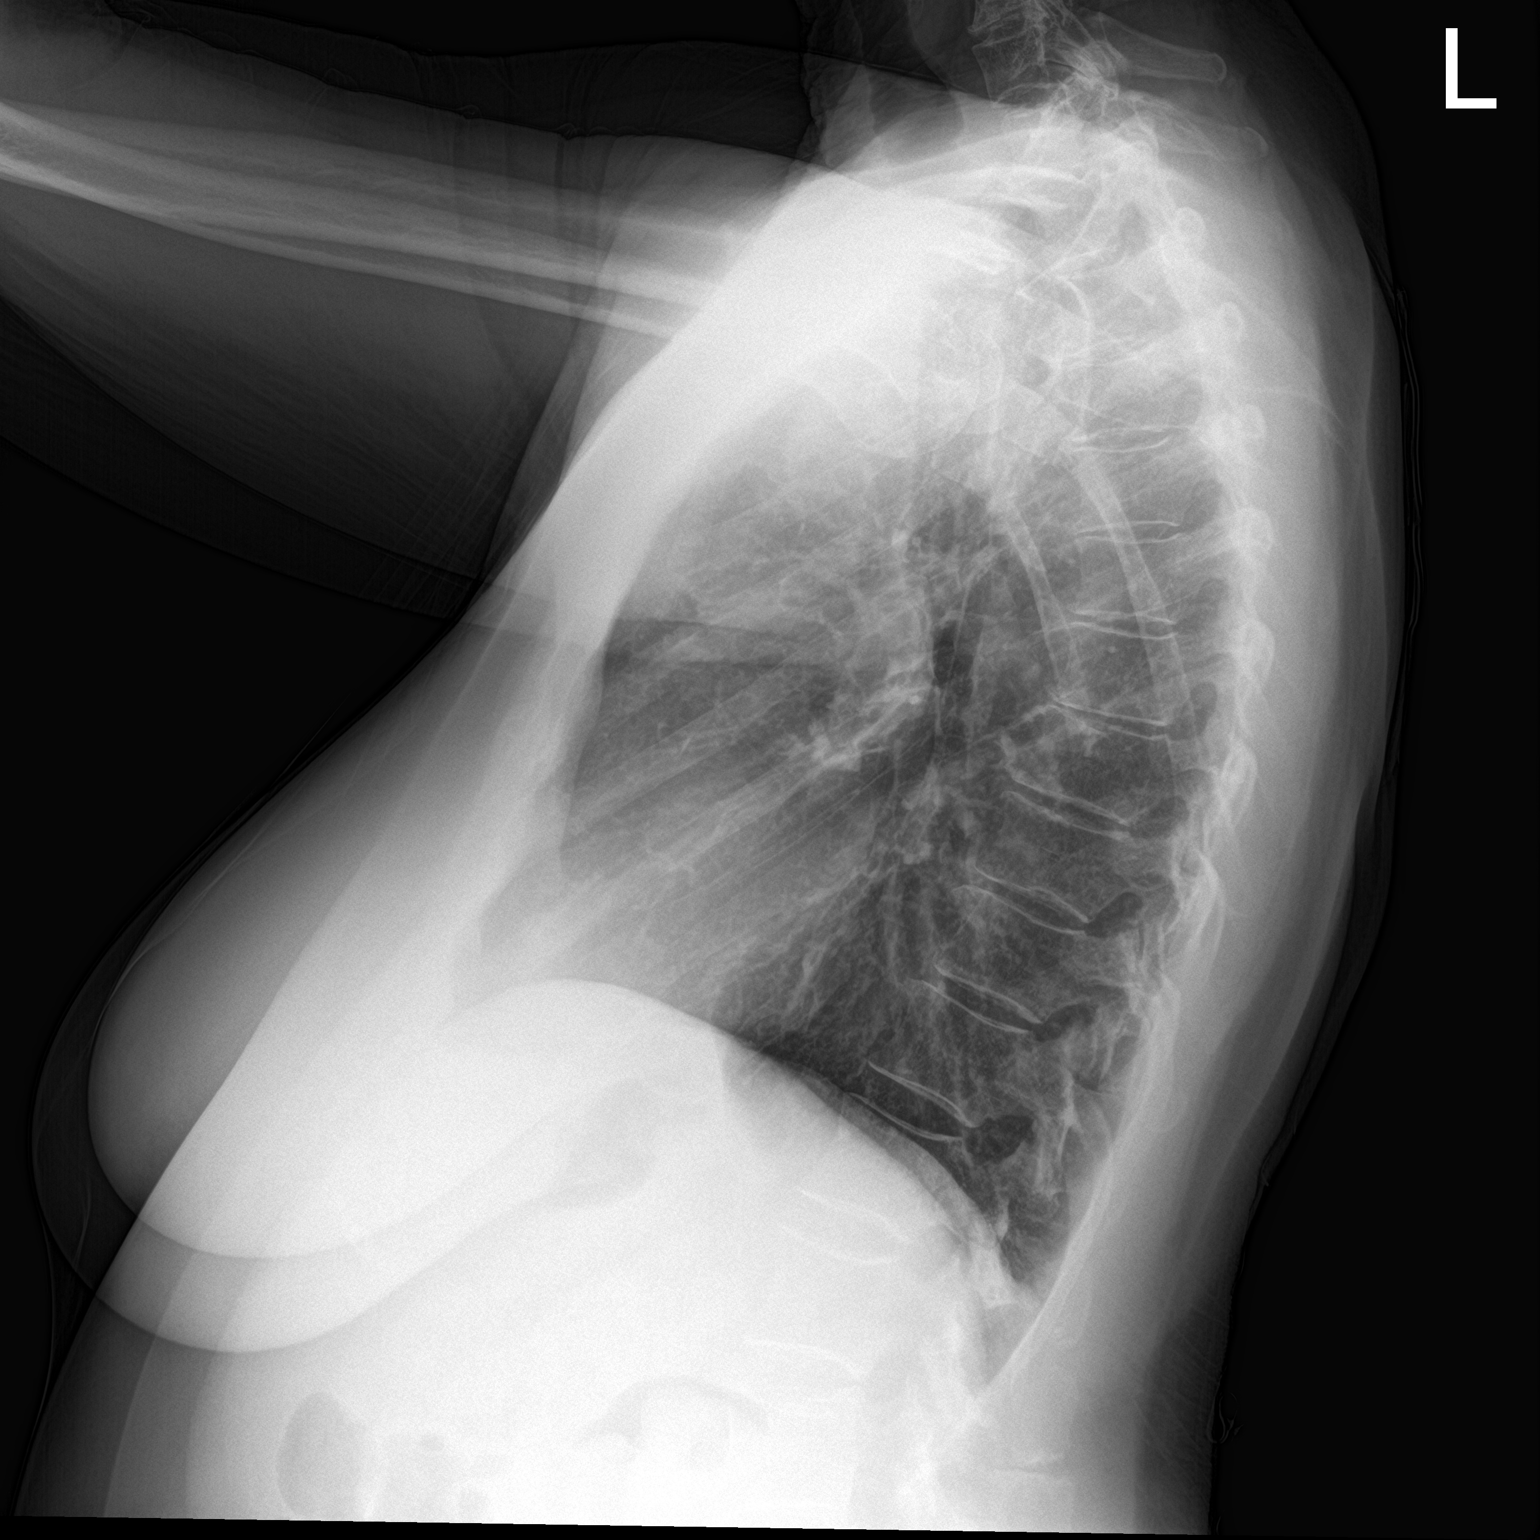

[2 of 2 positions shown; findings below may reference images not displayed]

FINDINGS: Lung volumes and mediastinal contours are stable and within normal
limits. Visualized tracheal air column is within normal limits.
Stable lung markings since 0685. No pneumothorax, pulmonary edema,
pleural effusion or acute pulmonary opacity.

No acute osseous abnormality identified. Negative visible bowel gas
pattern.
IMPRESSION: No acute cardiopulmonary abnormality.

## 2023-07-01 DIAGNOSIS — Z1231 Encounter for screening mammogram for malignant neoplasm of breast: Secondary | ICD-10-CM | POA: Diagnosis not present

## 2023-07-01 DIAGNOSIS — R92323 Mammographic fibroglandular density, bilateral breasts: Secondary | ICD-10-CM | POA: Diagnosis not present

## 2023-08-08 ENCOUNTER — Other Ambulatory Visit: Payer: Self-pay | Admitting: Medical-Surgical

## 2023-09-22 ENCOUNTER — Ambulatory Visit
Admission: RE | Admit: 2023-09-22 | Discharge: 2023-09-22 | Disposition: A | Discharge status: Home / Self Care | Source: Ambulatory Visit | Attending: Family Medicine | Admitting: Family Medicine

## 2023-09-22 ENCOUNTER — Other Ambulatory Visit: Payer: Self-pay

## 2023-09-22 VITALS — BP 112/75 | HR 92 | Temp 97.9°F | Resp 16

## 2023-09-22 DIAGNOSIS — N390 Urinary tract infection, site not specified: Secondary | ICD-10-CM | POA: Diagnosis not present

## 2023-09-22 DIAGNOSIS — R300 Dysuria: Secondary | ICD-10-CM | POA: Diagnosis not present

## 2023-09-22 LAB — POCT URINALYSIS DIP (MANUAL ENTRY)
Glucose, UA: NEGATIVE mg/dL
Ketones, POC UA: NEGATIVE mg/dL
Nitrite, UA: NEGATIVE
Protein Ur, POC: 100 mg/dL — AB
Spec Grav, UA: 1.025 (ref 1.010–1.025)
Urobilinogen, UA: 0.2 U/dL
pH, UA: 5.5 (ref 5.0–8.0)

## 2023-09-22 MED ORDER — SULFAMETHOXAZOLE-TRIMETHOPRIM 800-160 MG PO TABS: 1.0000 | ORAL_TABLET | Freq: Two times a day (BID) | ORAL | 0 refills | Status: AC

## 2023-09-22 NOTE — Discharge Instructions (Signed)
 Take the antibiotic 2 times a day Drink lots of water Consider going back on the estrogen cream at least 3 times a week  The urine has been sent to the laboratory for culture.  You will be called if any change in antibiotics is needed

## 2023-09-22 NOTE — ED Provider Notes (Signed)
 Sheryl Freeman    CSN: [id number] Arrival date & time: 09/22/23  0845      History   Chief Complaint Chief Complaint  Patient presents with   Dysuria    HPI Sheryl Freeman is a 78 y.o. female.   Patient has recurring urinary tract infections.  She has a sudden onset of dysuria and frequency this morning and noticed cloudiness in her urine.  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      Home Medications    Prior to Admission medications   Medication Sig Start Date End Date Taking? Authorizing Provider  sulfamethoxazole -trimethoprim  (BACTRIM  DS) 800-160 MG tablet Take 1 tablet by mouth 2 (two) times daily for 7 days. 09/22/23 09/29/23 Yes Maranda Jamee Jacob, MD  albuterol  (VENTOLIN  HFA) 108 [phone] Base) MCG/ACT inhaler INHALE 1 TO 2 PUFFS INTO THE LUNGS EVERY 4 HOURS AS NEEDED FOR WHEEZING OR SHORTNESS OF BREATH 09/18/21   Willo Mini, NP  budesonide -formoterol  (SYMBICORT ) 80-4.5 MCG/ACT inhaler NEEDS APPOINTMENT  FOR FURTHER REFILLS. INHALE 2 PUFFS BY MOUTH FIRST THING IN THE MORNING, THEN ANOTHER 2 PUFFS ABOUT 12 HOURS LATER 08/08/23   Willo Mini, NP  chlorpheniramine (CHLOR-TRIMETON) 4 MG tablet Take 4 mg by mouth 2 (two) times daily as needed for allergies.    [provider]  Coenzyme Q10 (COQ10) 50 MG CAPS Take 100 mg by mouth daily. 05/25/19   Alexander, Natalie, DO  famotidine  (PEPCID ) 20 MG tablet Take 1 tablet (20 mg total) by mouth daily. 12/28/20   Willo Mini, NP  hydrochlorothiazide  (HYDRODIURIL ) 12.5 MG tablet Take 1 tablet (12.5 mg total) by mouth daily. 05/21/22   Willo Mini, NP  lidocaine  (LIDODERM ) 5 % Place 1 patch onto the skin every 12 (twelve) hours. Remove & Discard patch within 12 hours or as directed by MD 12/28/20   Willo Mini, NP  simvastatin  (ZOCOR ) 40 MG tablet Take 1 tablet (40 mg total) by mouth daily at 6 PM. 05/21/22   Willo Mini, NP  Spacer/Aero-Holding [employer] (E-Z SPACER) inhaler Use as instructed 07/21/18   Marsa Edelman, DO    Family History Family History  Problem Relation Age of Onset   Alzheimer's disease Mother    Diabetes Father    Liver disease Father     Social History Social History   Tobacco Use   Smoking status: Never   Smokeless tobacco: Never  Vaping Use   Vaping status: Never Used  Substance Use Topics   Alcohol use: No  Drug use: No     Allergies   Patient has no known allergies.   Review of Systems Review of Systems See HPI  Physical Exam Triage Vital Signs ED Triage Vitals  Encounter Vitals Group     BP 09/22/23 0854 112/75     Girls Systolic BP Percentile --      Girls Diastolic BP Percentile --      Boys Systolic BP Percentile --      Boys Diastolic BP Percentile --      Pulse Rate 09/22/23 0854 92     Resp 09/22/23 0854 16     Temp 09/22/23 0854 97.9 F (36.6 C)     Temp src --      SpO2 09/22/23 0854 97 %     Weight --      Height --      Head Circumference --      Peak Flow --      Pain Score  09/22/23 0857 7     Pain Loc --      Pain Education --      Exclude from Growth Chart --    No data found.  Updated Vital Signs BP 112/75   Pulse 92   Temp 97.9 F (36.6 C)   Resp 16   SpO2 97%       Physical Exam Constitutional:      General: She is not in acute distress.    Appearance: She is well-developed.  HENT:     Head: Normocephalic and atraumatic.  Eyes:     Conjunctiva/sclera: Conjunctivae normal.     Pupils: Pupils are equal, round, and reactive to light.  Cardiovascular:     Rate and Rhythm: Normal rate.  Pulmonary:     Effort: Pulmonary effort is normal. No respiratory distress.  Abdominal:     Tenderness: There is no right CVA tenderness or left CVA tenderness.  Musculoskeletal:        General: Normal range of motion.     Cervical back: Normal range of motion.  Skin:    General: Skin is warm and dry.  Neurological:     Mental Status: She is alert.      UC Treatments / Results  Labs (all labs ordered are listed, but only abnormal results are displayed) Labs Reviewed  POCT URINALYSIS DIP (MANUAL ENTRY) - Abnormal; Notable for the following components:      Result Value   Clarity, UA cloudy (*)    Bilirubin, UA small (*)    Blood, UA large (*)    Protein Ur, POC =100 (*)    Leukocytes, UA Large (3+) (*)    All other components within normal limits  URINE CULTURE    EKG   Radiology No results found.  Procedures Procedures (including critical Freeman time)  Medications Ordered in UC Medications - No data to display  Initial Impression / Assessment and Plan / UC Course  I have reviewed the triage vital signs and the nursing notes.  Pertinent labs & imaging results that were available during my Freeman of the patient were reviewed by me and considered in my medical decision making (see chart for details).    Final Clinical Impressions(s) / UC Diagnoses   Final diagnoses:  Dysuria  Lower urinary tract infectious disease     Discharge  Instructions      Take the antibiotic 2 times a day Drink lots of water Consider going back on the estrogen cream at least  3 times a week  The urine has been sent to the laboratory for culture.  You will be called if any change in antibiotics is needed   ED Prescriptions     Medication Sig Dispense Auth. Provider   sulfamethoxazole -trimethoprim  (BACTRIM  DS) 800-160 MG tablet Take 1 tablet by mouth 2 (two) times daily for 7 days. 14 tablet Maranda Jamee Jacob, MD      PDMP not reviewed this encounter.   Maranda Jamee Jacob, MD 09/22/23 1011

## 2023-09-22 NOTE — ED Triage Notes (Signed)
 Woke up this morning with painful urination, urinary urgency. No fever. Has lower back pain and bladder pain. No otc meds.

## 2023-09-24 LAB — URINE CULTURE: Culture: >=100000 — AB

## 2023-09-25 ENCOUNTER — Ambulatory Visit (HOSPITAL_COMMUNITY): Payer: Self-pay

## 2023-09-25 MED ORDER — CEPHALEXIN 500 MG PO CAPS: 500.0000 mg | ORAL_CAPSULE | Freq: Four times a day (QID) | ORAL | 0 refills | Status: AC

## 2023-11-01 DIAGNOSIS — J45909 Unspecified asthma, uncomplicated: Secondary | ICD-10-CM | POA: Diagnosis not present

## 2023-11-01 DIAGNOSIS — J069 Acute upper respiratory infection, unspecified: Secondary | ICD-10-CM | POA: Diagnosis not present
